# Patient Record
Sex: Male | Born: 1960 | Race: White | Hispanic: No | Marital: Married | Smoking: Former smoker
Health system: Southern US, Community
[De-identification: ages and names within clinical notes are randomized; demographics above are authoritative.]

## PROBLEM LIST (undated history)

## (undated) DIAGNOSIS — Z87442 Personal history of urinary calculi: Secondary | ICD-10-CM

## (undated) DIAGNOSIS — N289 Disorder of kidney and ureter, unspecified: Secondary | ICD-10-CM

## (undated) HISTORY — PX: KNEE SURGERY: SHX244

## (undated) HISTORY — PX: WRIST SURGERY: SHX841

## (undated) HISTORY — PX: SHOULDER SURGERY: SHX246

---

## 1998-09-14 ENCOUNTER — Ambulatory Visit (HOSPITAL_BASED_OUTPATIENT_CLINIC_OR_DEPARTMENT_OTHER): Admission: RE | Admit: 1998-09-14 | Discharge: 1998-09-14 | Payer: Self-pay | Admitting: Orthopedic Surgery

## 2000-08-12 ENCOUNTER — Encounter: Payer: Self-pay | Admitting: Emergency Medicine

## 2000-08-12 ENCOUNTER — Encounter: Admission: RE | Admit: 2000-08-12 | Discharge: 2000-08-12 | Payer: Self-pay | Admitting: Emergency Medicine

## 2002-05-03 ENCOUNTER — Encounter: Payer: Self-pay | Admitting: Emergency Medicine

## 2002-05-03 ENCOUNTER — Encounter: Admission: RE | Admit: 2002-05-03 | Discharge: 2002-05-03 | Payer: Self-pay | Admitting: Emergency Medicine

## 2002-07-07 ENCOUNTER — Ambulatory Visit (HOSPITAL_COMMUNITY): Admission: RE | Admit: 2002-07-07 | Discharge: 2002-07-07 | Payer: Self-pay | Admitting: Gastroenterology

## 2002-11-10 ENCOUNTER — Encounter: Admission: RE | Admit: 2002-11-10 | Discharge: 2002-11-10 | Payer: Self-pay | Admitting: Emergency Medicine

## 2002-11-10 ENCOUNTER — Encounter: Payer: Self-pay | Admitting: Emergency Medicine

## 2004-09-13 ENCOUNTER — Encounter: Admission: RE | Admit: 2004-09-13 | Discharge: 2004-09-13 | Payer: Self-pay | Admitting: Emergency Medicine

## 2004-09-28 ENCOUNTER — Encounter: Admission: RE | Admit: 2004-09-28 | Discharge: 2004-09-28 | Payer: Self-pay | Admitting: Emergency Medicine

## 2004-10-12 ENCOUNTER — Encounter: Admission: RE | Admit: 2004-10-12 | Discharge: 2004-10-12 | Payer: Self-pay | Admitting: Emergency Medicine

## 2006-10-11 ENCOUNTER — Emergency Department (HOSPITAL_COMMUNITY): Admission: EM | Admit: 2006-10-11 | Discharge: 2006-10-11 | Payer: Self-pay | Admitting: Emergency Medicine

## 2007-01-11 ENCOUNTER — Emergency Department (HOSPITAL_COMMUNITY): Admission: EM | Admit: 2007-01-11 | Discharge: 2007-01-11 | Payer: Self-pay | Admitting: Emergency Medicine

## 2008-09-08 ENCOUNTER — Encounter: Admission: RE | Admit: 2008-09-08 | Discharge: 2008-11-10 | Payer: Self-pay | Admitting: Emergency Medicine

## 2009-09-27 ENCOUNTER — Encounter: Admission: RE | Admit: 2009-09-27 | Discharge: 2009-09-27 | Payer: Self-pay | Admitting: Internal Medicine

## 2010-12-01 ENCOUNTER — Encounter: Payer: Self-pay | Admitting: Emergency Medicine

## 2011-03-29 NOTE — Op Note (Signed)
TNAME:  [name], Walter A                       ACCOUNT NO.:  [id number]   MEDICAL RECORD NO.:  [id number]                   PATIENT TYPE:  AMB   LOCATION:  ENDO                                 FACILITY:  [facility]   PHYSICIAN:  Charolett Bumpers, M.D.             DATE OF BIRTH:  20-Oct-1961   DATE OF PROCEDURE:  07/07/2002  DATE OF DISCHARGE:                                 OPERATIVE REPORT   PROCEDURE:  Esophagogastroduodenoscopy.   INDICATIONS FOR PROCEDURE:  Mr. [name] is a 50 year old male born  10-21-1961. Mr. Cowgill has heartburn which is controlled when he  takes his Nexium and also has episodes of nausea unassociated with vomiting,  dysphagia, odynophagia, abdominal pain or gastrointestinal bleeding.   On July 24, 2000, his H. pylori serology was negative. His hepatic  profile was normal. A CBC with differential was normal. His complete  metabolic profile, amylase and lipase were normal.   PAST MEDICAL HISTORY:  1. Migraine headaches.  2. Knee arthroscopy.   ENDOSCOPIST:  Danise Edge, M.D.   PREMEDICATION:  Versed 5 mg, Demerol 50 mg.   ENDOSCOPE:  Olympus gastroscope.   DESCRIPTION OF PROCEDURE:  After obtaining informed consent, Mr. [name] was  placed in the left lateral decubitus position. I administered intravenous  Demerol and intravenous Versed to achieve conscious sedation for the  procedure. The patient's blood pressure, oxygen saturation and cardiac  rhythm were monitored throughout the procedure and documented in the medical  record.   The Olympus gastroscope was passed through the posterior hypopharynx into  the proximal esophagus without difficulty. The hypopharynx, larynx and vocal  cords appeared normal.   ESOPHAGOSCOPY:  The proximal, mid and lower segments of the esophageal  mucosa appeared completely normal. The squamocolumnar junction and  esophagogastric junction are noted at 45 cm from the incisor teeth.  Endoscopically  there is no evidence for the presence of Barrett's esophagus,  esophageal mucosal scarring, erosive esophagitis, or esophageal ulceration.   GASTROSCOPY:  Retroflexed view of the gastric cardia and fundus was normal.  The gastric body, antrum, and pylorus appeared normal.   DUODENOSCOPY:  The duodenal bulb, mid duodenum and distal duodenum appeared  normal.   ASSESSMENT:  Normal esophagogastroduodenoscopy.    RECOMMENDATIONS:  [name] tells me that he has no heartburn if he takes  his Nexium. He may have associated gastroparesis to explain his episodes of  nausea. Reglan can be used empirically for nausea or [name] can be  scheduled for a nuclear medicine solid phase gastric emptying study to  determine if he has gastroparesis associated with gastroesophageal reflux  disease.                                               Charolett Bumpers, M.D.  MKJ/MEDQ  D:  07/07/2002  T:  07/08/2002  Job:  [id number]   cc:   Reuben Likes, M.D.

## 2011-07-01 ENCOUNTER — Ambulatory Visit (HOSPITAL_BASED_OUTPATIENT_CLINIC_OR_DEPARTMENT_OTHER)
Admission: RE | Admit: 2011-07-01 | Discharge: 2011-07-01 | Disposition: A | Discharge status: Home / Self Care | Payer: Managed Care, Other (non HMO) | Source: Ambulatory Visit | Attending: Orthopedic Surgery | Admitting: Orthopedic Surgery

## 2011-07-01 DIAGNOSIS — Z01812 Encounter for preprocedural laboratory examination: Secondary | ICD-10-CM | POA: Insufficient documentation

## 2011-07-01 DIAGNOSIS — M25819 Other specified joint disorders, unspecified shoulder: Secondary | ICD-10-CM | POA: Insufficient documentation

## 2011-07-01 DIAGNOSIS — M19019 Primary osteoarthritis, unspecified shoulder: Secondary | ICD-10-CM | POA: Insufficient documentation

## 2011-07-01 DIAGNOSIS — M67919 Unspecified disorder of synovium and tendon, unspecified shoulder: Secondary | ICD-10-CM | POA: Insufficient documentation

## 2011-07-01 DIAGNOSIS — M719 Bursopathy, unspecified: Secondary | ICD-10-CM | POA: Insufficient documentation

## 2011-07-01 DIAGNOSIS — K219 Gastro-esophageal reflux disease without esophagitis: Secondary | ICD-10-CM | POA: Insufficient documentation

## 2011-07-01 DIAGNOSIS — M24119 Other articular cartilage disorders, unspecified shoulder: Secondary | ICD-10-CM | POA: Insufficient documentation

## 2011-07-01 LAB — POCT HEMOGLOBIN-HEMACUE: Hemoglobin: 15.2 g/dL (ref 13.0–17.0)

## 2011-07-08 NOTE — Op Note (Signed)
NAME:  Walter [name], Walter [name]              ACCOUNT NO.:  [id number]  MEDICAL RECORD NO.:  [id number]  LOCATION:                                 FACILITY:  PHYSICIAN:  Feliberto Gottron. Turner Daniels, M.D.        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  PROCEDURE:  Right shoulder arthroscopic distal clavicle excision, acromioplasty, debridement of inflamed bursa as well as a degenerative tear of the labrum.  SURGEON:  Feliberto Gottron. Turner Daniels, MD  FIRST ASSISTANT:  Shirl Harris, PA.  Anesthetic was interscalene block right plus general endotracheal.  ESTIMATED BLOOD LOSS:  Minimal.  FLUID REPLACEMENT:  Liter of crystalloid.  DRAINS PLACED:  None.  TOURNIQUET TIME:  None.  INDICATIONS FOR PROCEDURE:  The patient is a 50 year old man with symptomatic AC joint arthritis of his right shoulder.  He has failed conservative treatment, anti-inflammatory medicines, exercises, physical therapy, subacromial injection, and AC joint injection gave good temporary relief, but his pain is now returned.  He desires elective arthroscopic distal clavicle excision and decompression of his right shoulder.  MRI scan in 2010 was negative for a rotator cuff tear, so it is unlikely he will need a cuff repair, but we will do if he does.  We will also address any intra-articular pathology while we are there.  The risks and benefits of surgery have been discussed, questions answered.  DESCRIPTION OF PROCEDURE:  The patient identified by armband and received interscalene block anesthetic in the holding area at [facility].  He also received preoperative IV antibiotics and then was taken to  the operating room 5 where the appropriate anesthetic monitors were attached and general endotracheal anesthesia induced with the patient in supine position.  He was then placed in the beach chair position and the right upper extremity prepped and draped in sterile fashion from the ribs to the hemithorax.  Using a #11 blade, we then made standard portals 1.5 cm anterior to the [facility] joint lateral to the junction of the middle and posterior thirds acromion and posterior and posterolateral corner of the acromion process.  The inflow was placed anteriorly, the arthroscope laterally, and a 4.2 great white sucker shaver posteriorly.  The subacromial bursa was quite inflamed.  It was quite tight with traction on the shoulder and subacromial bursectomy was accomplished.  Small bleeders were identified and cauterized with the ArthroCare wand.  We then performed a subacromial decompression and acromioplasty using a 4.5 hooded vortex bur, which gave [place] enough space to examine the rotator cuff, which was intact externally.  The patient had a huge distal clavicle spur, which was then excised with a 4.5 hooded vortex bur removing the distal centimeter of the inferior aspect of the clavicle.  The distal clavicle itself was denuded of any cartilage and was truly arthritic.  We then made a supplemental anterior portal 1 cm medial to the first anterior portal allowing introduction of the 4.5  vortex through that portal.  Once the distal clavicle excision had been accomplished, photographic documentation was made at this portion of the procedure.  The arthroscope was then repositioned in the glenohumeral joint using the posterior portal.  There we documented normal articular cartilage in the glenohumeral joint.  There was some mild degeneration of the labrum.  This was widely debrided.  We used a 3.5 gator sucker shaver for the debridement of the labrum.  We documented the integrity of the supraspinatus and  infraspinatus insertions as well as the biceps anchor and again the articular surface of the glenohumeral joint.  The shoulder was then irrigated out with normal saline solution.  Arthroscopic instruments were removed and a dressing of Xeroform, 4 x 4 dressing sponges, paper tape, and a sling applied.  At this point, the patient was laid supine, awakened, extubated, and taken to the recovery room without difficulty.     Feliberto Gottron. Turner Daniels, M.D.     Ovid Curd  D:  07/01/2011  T:  07/02/2011  Job:  [id number]  Electronically Signed by Gean Birchwood M.D. on 07/08/2011 01:10:41 PM

## 2015-05-04 ENCOUNTER — Other Ambulatory Visit: Payer: Self-pay | Admitting: Internal Medicine

## 2015-05-04 DIAGNOSIS — R1011 Right upper quadrant pain: Secondary | ICD-10-CM

## 2015-05-08 ENCOUNTER — Other Ambulatory Visit: Payer: Self-pay | Admitting: Internal Medicine

## 2015-05-08 ENCOUNTER — Ambulatory Visit
Admission: RE | Admit: 2015-05-08 | Discharge: 2015-05-08 | Disposition: A | Discharge status: Home / Self Care | Payer: Managed Care, Other (non HMO) | Source: Ambulatory Visit | Attending: Internal Medicine | Admitting: Internal Medicine

## 2015-05-08 DIAGNOSIS — R1011 Right upper quadrant pain: Secondary | ICD-10-CM

## 2015-10-06 ENCOUNTER — Emergency Department (HOSPITAL_COMMUNITY)
Admission: EM | Admit: 2015-10-06 | Discharge: 2015-10-06 | Disposition: A | Discharge status: Home / Self Care | Payer: 59 | Source: Home / Self Care | Attending: Family Medicine | Admitting: Family Medicine

## 2015-10-06 ENCOUNTER — Encounter (HOSPITAL_COMMUNITY): Payer: Self-pay | Admitting: Emergency Medicine

## 2015-10-06 DIAGNOSIS — N200 Calculus of kidney: Secondary | ICD-10-CM

## 2015-10-06 HISTORY — DX: Disorder of kidney and ureter, unspecified: N28.9

## 2015-10-06 LAB — POCT URINALYSIS DIP (DEVICE)
Bilirubin Urine: NEGATIVE
Glucose, UA: NEGATIVE mg/dL
Ketones, ur: NEGATIVE mg/dL
Nitrite: NEGATIVE
PH: 6 (ref 5.0–8.0)
PROTEIN: NEGATIVE mg/dL
SPECIFIC GRAVITY, URINE: 1.015 (ref 1.005–1.030)
Urobilinogen, UA: 0.2 mg/dL (ref 0.0–1.0)

## 2015-10-06 NOTE — ED Notes (Signed)
C/o kidney stone concerns.  Reports that starting Monday, noted changes in urinary habits.  Has noticed blood in urine on Monday and Wednesday.  Complains of back pain and pain that radiates into groin and testicular pain.  Patient particularly noticed changes in night time urinating.  Patient having top get up frequently, feeling the urge, but little results and no relief at that time.

## 2015-10-06 NOTE — ED Provider Notes (Signed)
CSN: [id number]     Arrival date & time 10/06/15  1311 History   First MD Initiated Contact with Patient 10/06/15 1436     Chief Complaint  Patient presents with  . Urinary Retention   (Consider location/radiation/quality/duration/timing/severity/associated sxs/prior Treatment) Patient is a 54 y.o. male presenting with frequency. The history is provided by the patient and the spouse.  Urinary Frequency This is a new problem. Episode onset: has urologist appt on wed, h/o 4 stones passed, present stone on right side , is having difficulty with urine passage but pt and wife report nl cystoscopyand no prostate problems, also taking rapiflow med. The problem has not changed since onset.Pertinent negatives include no abdominal pain.    Past Medical History  Diagnosis Date  . Renal disorder    Past Surgical History  Procedure Laterality Date  . Knee surgery    . Wrist surgery    . Shoulder surgery     History reviewed. No pertinent family history. Social History  Substance Use Topics  . Smoking status: Never Smoker   . Smokeless tobacco: None  . Alcohol Use: Yes    Review of Systems  Gastrointestinal: Negative.  Negative for abdominal pain.  Genitourinary: Positive for difficulty urinating and testicular pain. Negative for dysuria, urgency, frequency, flank pain, decreased urine volume, penile swelling, scrotal swelling and enuresis.  Musculoskeletal: Negative.   All other systems reviewed and are negative.   Allergies  Review of patient's allergies indicates no known allergies.  Home Medications   Prior to Admission medications   Medication Sig Start Date End Date Taking? Authorizing Provider  Silodosin (RAPAFLO PO) Take by mouth.   Yes Historical Provider, MD   Meds Ordered and Administered this Visit  Medications - No data to display  BP 129/84 mmHg  Pulse 69  Temp(Src) 98.7 F (37.1 C) (Oral)  SpO2 98% No data found.   Physical Exam  Constitutional: He is  oriented to person, place, and time. He appears well-developed and well-nourished. No distress.  Pulmonary/Chest: Effort normal and breath sounds normal.  Abdominal: Soft. Bowel sounds are normal. He exhibits no distension and no mass. There is no tenderness. There is no rebound and no guarding.  Musculoskeletal: He exhibits no edema or tenderness.  Neurological: He is alert and oriented to person, place, and time.  Skin: Skin is warm and dry.  Nursing note and vitals reviewed.   ED Course  Procedures (including critical care time)  Labs Review Labs Reviewed  POCT URINALYSIS DIP (DEVICE) - Abnormal; Notable for the following:    Hgb urine dipstick TRACE (*)    Leukocytes, UA TRACE (*)    All other components within normal limits   U/a neg.  Imaging Review No results found.   Visual Acuity Review  Right Eye Distance:   Left Eye Distance:   Bilateral Distance:    Right Eye Near:   Left Eye Near:    Bilateral Near:         MDM   1. Kidney stone on right side        Billy Fischer, MD 10/06/15 [phone]

## 2015-10-06 NOTE — Discharge Instructions (Signed)
Continue your medicine, drink plenty of water, see your urologist as planned or call if problem gets worse.

## 2016-10-15 ENCOUNTER — Other Ambulatory Visit: Payer: Self-pay | Admitting: Urology

## 2016-10-15 ENCOUNTER — Encounter (HOSPITAL_COMMUNITY): Payer: Self-pay | Admitting: *Deleted

## 2016-10-16 ENCOUNTER — Emergency Department (HOSPITAL_COMMUNITY): Payer: 59 | Admitting: Certified Registered Nurse Anesthetist

## 2016-10-16 ENCOUNTER — Encounter (HOSPITAL_COMMUNITY)
Admission: EM | Disposition: A | Discharge status: Home / Self Care | Payer: Self-pay | Source: Home / Self Care | Attending: Emergency Medicine

## 2016-10-16 ENCOUNTER — Observation Stay (HOSPITAL_COMMUNITY)
Admission: EM | Admit: 2016-10-16 | Discharge: 2016-10-17 | Disposition: A | Discharge status: Home / Self Care | Payer: 59 | Attending: Urology | Admitting: Urology

## 2016-10-16 DIAGNOSIS — N202 Calculus of kidney with calculus of ureter: Principal | ICD-10-CM | POA: Insufficient documentation

## 2016-10-16 DIAGNOSIS — N23 Unspecified renal colic: Secondary | ICD-10-CM

## 2016-10-16 DIAGNOSIS — Z87891 Personal history of nicotine dependence: Secondary | ICD-10-CM | POA: Insufficient documentation

## 2016-10-16 DIAGNOSIS — N201 Calculus of ureter: Secondary | ICD-10-CM | POA: Diagnosis present

## 2016-10-16 DIAGNOSIS — E860 Dehydration: Secondary | ICD-10-CM | POA: Diagnosis not present

## 2016-10-16 DIAGNOSIS — N200 Calculus of kidney: Secondary | ICD-10-CM

## 2016-10-16 DIAGNOSIS — Z87442 Personal history of urinary calculi: Secondary | ICD-10-CM | POA: Diagnosis not present

## 2016-10-16 HISTORY — PX: CYSTOSCOPY W/ URETERAL STENT PLACEMENT: SHX1429

## 2016-10-16 LAB — CBC WITH DIFFERENTIAL/PLATELET
BASOS ABS: 0 10*3/uL (ref 0.0–0.1)
BASOS PCT: 0 %
EOS PCT: 0 %
Eosinophils Absolute: 0 10*3/uL (ref 0.0–0.7)
HEMATOCRIT: 40.3 % (ref 39.0–52.0)
Hemoglobin: 13.8 g/dL (ref 13.0–17.0)
Lymphocytes Relative: 5 %
Lymphs Abs: 0.6 10*3/uL — ABNORMAL LOW (ref 0.7–4.0)
MCH: 30.3 pg (ref 26.0–34.0)
MCHC: 34.2 g/dL (ref 30.0–36.0)
MCV: 88.6 fL (ref 78.0–100.0)
Monocytes Absolute: 0.6 10*3/uL (ref 0.1–1.0)
Monocytes Relative: 6 %
NEUTROS ABS: 9.1 10*3/uL — AB (ref 1.7–7.7)
Neutrophils Relative %: 89 %
PLATELETS: 217 10*3/uL (ref 150–400)
RBC: 4.55 MIL/uL (ref 4.22–5.81)
RDW: 12.6 % (ref 11.5–15.5)
WBC: 10.3 10*3/uL (ref 4.0–10.5)

## 2016-10-16 LAB — URINALYSIS, ROUTINE W REFLEX MICROSCOPIC
Bilirubin Urine: NEGATIVE
GLUCOSE, UA: NEGATIVE mg/dL
Ketones, ur: 5 mg/dL — AB
Nitrite: NEGATIVE
PROTEIN: NEGATIVE mg/dL
Specific Gravity, Urine: 1.023 (ref 1.005–1.030)
Squamous Epithelial / HPF: NONE SEEN
pH: 5 (ref 5.0–8.0)

## 2016-10-16 LAB — BASIC METABOLIC PANEL
Anion gap: 8 (ref 5–15)
BUN: 21 mg/dL — ABNORMAL HIGH (ref 6–20)
CO2: 24 mmol/L (ref 22–32)
Calcium: 8.7 mg/dL — ABNORMAL LOW (ref 8.9–10.3)
Chloride: 103 mmol/L (ref 101–111)
Creatinine, Ser: 1.4 mg/dL — ABNORMAL HIGH (ref 0.61–1.24)
GFR, EST NON AFRICAN AMERICAN: 56 mL/min — AB (ref >60)
Glucose, Bld: 126 mg/dL — ABNORMAL HIGH (ref 65–99)
Potassium: 4.5 mmol/L (ref 3.5–5.1)
Sodium: 135 mmol/L (ref 135–145)

## 2016-10-16 SURGERY — CYSTOSCOPY, WITH RETROGRADE PYELOGRAM AND URETERAL STENT INSERTION
Anesthesia: General | Laterality: Left

## 2016-10-16 MED ORDER — HYDROMORPHONE HCL 1 MG/ML IJ SOLN
1.0000 mg | Freq: Once | INTRAMUSCULAR | Status: AC
  Administered 2016-10-16: 1 mg via INTRAVENOUS
  Filled 2016-10-16: qty 1

## 2016-10-16 MED ORDER — LACTATED RINGERS IV SOLN
INTRAVENOUS | Status: DC
  Administered 2016-10-16: 23:00:00 via INTRAVENOUS

## 2016-10-16 MED ORDER — FENTANYL CITRATE (PF) 100 MCG/2ML IJ SOLN
INTRAMUSCULAR | Status: DC | PRN
  Administered 2016-10-16 (×2): 25 ug via INTRAVENOUS
  Administered 2016-10-16: 50 ug via INTRAVENOUS

## 2016-10-16 MED ORDER — KETOROLAC TROMETHAMINE 15 MG/ML IJ SOLN
30.0000 mg | Freq: Once | INTRAMUSCULAR | Status: AC
  Administered 2016-10-16: 30 mg via INTRAVENOUS
  Filled 2016-10-16: qty 2

## 2016-10-16 MED ORDER — ONDANSETRON HCL 4 MG/2ML IJ SOLN
INTRAMUSCULAR | Status: DC | PRN
  Administered 2016-10-16: 4 mg via INTRAVENOUS

## 2016-10-16 MED ORDER — SODIUM CHLORIDE 0.9 % IV BOLUS (SEPSIS)
1000.0000 mL | Freq: Once | INTRAVENOUS | Status: AC
  Administered 2016-10-16: 1000 mL via INTRAVENOUS

## 2016-10-16 MED ORDER — CEFAZOLIN SODIUM-DEXTROSE 2-3 GM-% IV SOLR
2.0000 g | Freq: Once | INTRAVENOUS | Status: AC
  Administered 2016-10-16: 2 g via INTRAVENOUS

## 2016-10-16 MED ORDER — LIDOCAINE 2% (20 MG/ML) 5 ML SYRINGE
INTRAMUSCULAR | Status: AC
  Filled 2016-10-16: qty 5

## 2016-10-16 MED ORDER — ONDANSETRON HCL 4 MG/2ML IJ SOLN
INTRAMUSCULAR | Status: AC
  Filled 2016-10-16: qty 2

## 2016-10-16 MED ORDER — IOHEXOL 300 MG/ML  SOLN
INTRAMUSCULAR | Status: DC | PRN
  Administered 2016-10-16: 10 mL

## 2016-10-16 MED ORDER — PROMETHAZINE HCL 25 MG/ML IJ SOLN: 6.2500 mg | INTRAMUSCULAR | Status: DC | PRN

## 2016-10-16 MED ORDER — CEFAZOLIN SODIUM-DEXTROSE 2-4 GM/100ML-% IV SOLN
INTRAVENOUS | Status: AC
  Filled 2016-10-16: qty 100

## 2016-10-16 MED ORDER — MIDAZOLAM HCL 2 MG/2ML IJ SOLN
INTRAMUSCULAR | Status: AC
  Filled 2016-10-16: qty 2

## 2016-10-16 MED ORDER — PROPOFOL 10 MG/ML IV BOLUS
INTRAVENOUS | Status: AC
  Filled 2016-10-16: qty 20

## 2016-10-16 MED ORDER — HYDROMORPHONE HCL 1 MG/ML IJ SOLN: 0.2500 mg | INTRAMUSCULAR | Status: DC | PRN

## 2016-10-16 MED ORDER — SUCCINYLCHOLINE CHLORIDE 200 MG/10ML IV SOSY
PREFILLED_SYRINGE | INTRAVENOUS | Status: DC | PRN
  Administered 2016-10-16: 100 mg via INTRAVENOUS

## 2016-10-16 MED ORDER — FENTANYL CITRATE (PF) 100 MCG/2ML IJ SOLN
INTRAMUSCULAR | Status: AC
  Filled 2016-10-16: qty 2

## 2016-10-16 MED ORDER — SUCCINYLCHOLINE CHLORIDE 200 MG/10ML IV SOSY
PREFILLED_SYRINGE | INTRAVENOUS | Status: AC
  Filled 2016-10-16: qty 10

## 2016-10-16 MED ORDER — ONDANSETRON HCL 4 MG/2ML IJ SOLN
4.0000 mg | Freq: Once | INTRAMUSCULAR | Status: AC
  Administered 2016-10-16: 4 mg via INTRAVENOUS
  Filled 2016-10-16: qty 2

## 2016-10-16 MED ORDER — MIDAZOLAM HCL 5 MG/5ML IJ SOLN
INTRAMUSCULAR | Status: DC | PRN
  Administered 2016-10-16: 2 mg via INTRAVENOUS

## 2016-10-16 MED ORDER — PROPOFOL 10 MG/ML IV BOLUS
INTRAVENOUS | Status: DC | PRN
  Administered 2016-10-16: 180 mg via INTRAVENOUS

## 2016-10-16 MED ORDER — LIDOCAINE 2% (20 MG/ML) 5 ML SYRINGE
INTRAMUSCULAR | Status: DC | PRN
  Administered 2016-10-16: 100 mg via INTRAVENOUS

## 2016-10-16 MED ORDER — OXYBUTYNIN CHLORIDE 5 MG PO TABS: 5.0000 mg | ORAL_TABLET | Freq: Three times a day (TID) | ORAL | 1 refills | Status: AC | PRN

## 2016-10-16 MED ORDER — SODIUM CHLORIDE 0.9 % IR SOLN
Status: DC | PRN
  Administered 2016-10-16: 3000 mL

## 2016-10-16 SURGICAL SUPPLY — 14 items
BAG URO CATCHER STRL LF (MISCELLANEOUS) ×3 IMPLANT
CATH INTERMIT  6FR 70CM (CATHETERS) ×3 IMPLANT
CLOTH BEACON ORANGE TIMEOUT ST (SAFETY) ×3 IMPLANT
GLOVE BIOGEL M 8.0 STRL (GLOVE) ×3 IMPLANT
GOWN STRL REUS W/ TWL XL LVL3 (GOWN DISPOSABLE) ×1 IMPLANT
GOWN STRL REUS W/TWL LRG LVL3 (GOWN DISPOSABLE) ×3 IMPLANT
GOWN STRL REUS W/TWL XL LVL3 (GOWN DISPOSABLE) ×2
GUIDEWIRE ANG ZIPWIRE 038X150 (WIRE) IMPLANT
GUIDEWIRE STR DUAL SENSOR (WIRE) ×3 IMPLANT
MANIFOLD NEPTUNE II (INSTRUMENTS) ×3 IMPLANT
PACK CYSTO (CUSTOM PROCEDURE TRAY) ×3 IMPLANT
STENT CONTOUR 6FRX26X.038 (STENTS) ×3 IMPLANT
TUBING CONNECTING 10 (TUBING) ×2 IMPLANT
TUBING CONNECTING 10' (TUBING) ×1

## 2016-10-16 NOTE — H&P (Signed)
Urology History and Physical Exam  CC: Kidney stone  HPI: 55 year old male presents to the emergency room for persistent/intractable pain.  He was diagnosed with a left proximal ureteral stone 9 millimeters in size 2-3 days ago.  He was scheduled for shockwave lithotripsy on December 11 by Dr. Irine Seal at his first visit with [place] earlier this week.  Because of the patient, persistent pain, he came to the emergency room.  Despite repeated IV narcotics, he still has significant pain.  He is admitted at this time for stent placement for urgent decompression of his left renal unit in advance of eventual lithotripsy next week.  He denies any recent fever or chills.  He denies any vomiting, but has had nausea.  PMH: Past Medical History:  Diagnosis Date  . History of kidney stones   . Renal disorder     PSH: Past Surgical History:  Procedure Laterality Date  . KNEE SURGERY    . SHOULDER SURGERY    . WRIST SURGERY      Allergies: No Known Allergies  Medications:  (Not in a hospital admission)   Social History: Social History   Social History  . Marital status: Married    Spouse name: N/A  . Number of children: N/A  . Years of education: N/A   Occupational History  . Not on file.   Social History Main Topics  . Smoking status: Former Smoker    Packs/day: 1.00    Years: 8.00    Quit date: 10/15/1982  . Smokeless tobacco: Never Used  . Alcohol use Yes  . Drug use: No  . Sexual activity: Not on file   Other Topics Concern  . Not on file   Social History Narrative  . No narrative on file    Family History: No family history on file.  Review of Systems: Positive: Nausea, flank pain Negative:   A further 10 point review of systems was negative except what is listed in the HPI.                  Physical Exam: @VITALS2 @ General: No acute distress.  Awake. Head:  Normocephalic.  Atraumatic. ENT:  EOMI.  Mucous membranes moist Neck:  Supple.  No  lymphadenopathy. CV:  S1 present. S2 present. Regular rate. Pulmonary: Equal effort bilaterally.  Clear to auscultation bilaterally. Abdomen: Soft.  Costovertebral area on left tender to palpation. Skin:  Normal turgor.  No visible rash. Extremity: No gross deformity of bilateral upper extremities.  No gross deformity of                             lower extremities. Neurologic: Alert. Appropriate mood.   Studies:  Recent Labs     10/16/16  1938  HGB  13.8  WBC  10.3  PLT  217    Recent Labs     10/16/16  1938  NA  135  K  4.5  CL  103  CO2  24  BUN  21*  CREATININE  1.40*  CALCIUM  8.7*  GFRNONAA  56*  GFRAA  >60     No results for input(s): INR, APTT in the last 72 hours.  Invalid input(s): PT   Invalid input(s): ABG  I reviewed the patient's CT images.  He has a 6 by 10 millimeter left UPJ stone on CT scan recently performed.  Assessment:  Left upper ureteral stone, moderate in size  with intractable, recurrent pain  Plan: I discussed management with the patient, his wife and son.  I would recommend at this point, cystoscopy and double-J stent placement.  The patient was notified of the procedure, minimal risks and complications, and the fact that this stent will be left in until after his lithotripsy.  He agrees to proceed.

## 2016-10-16 NOTE — ED Notes (Signed)
Hospitalist at bedside 

## 2016-10-16 NOTE — Transfer of Care (Signed)
Immediate Anesthesia Transfer of Care Note  Patient: Walter [name]  Procedure(s) Performed: Procedure(s): CYSTOSCOPY WITH RETROGRADE PYELOGRAM/URETERAL STENT PLACEMENT (Left)  Patient Location: PACU  Anesthesia Type:General  Level of Consciousness:  sedated, patient cooperative and responds to stimulation  Airway & Oxygen Therapy:Patient Spontanous Breathing and Patient connected to face mask oxgen  Post-op Assessment:  Report given to PACU RN and Post -op Vital signs reviewed and stable  Post vital signs:  Reviewed and stable  Last Vitals:  Vitals:   10/16/16 2208 10/16/16 2326  BP: 120/73 125/81  Pulse: 88 95  Resp: 15 14  Temp:  37 C    Complications: No apparent anesthesia complications

## 2016-10-16 NOTE — Anesthesia Preprocedure Evaluation (Signed)
Anesthesia Evaluation  Patient identified by MRN, date of birth, ID band Patient awake    Reviewed: Allergy & Precautions, NPO status , Patient's Chart, lab work & pertinent test results  Airway Mallampati: II  TM Distance: >3 FB Neck ROM: Full    Dental no notable dental hx. (+) Dental Advisory Given   Pulmonary neg pulmonary ROS, former smoker,    Pulmonary exam normal        Cardiovascular negative cardio ROS Normal cardiovascular exam     Neuro/Psych negative neurological ROS  negative psych ROS   GI/Hepatic negative GI ROS, Neg liver ROS,   Endo/Other  negative endocrine ROS  Renal/GU   negative genitourinary   Musculoskeletal negative musculoskeletal ROS (+)   Abdominal   Peds negative pediatric ROS (+)  Hematology negative hematology ROS (+)   Anesthesia Other Findings   Reproductive/Obstetrics negative OB ROS                             Anesthesia Physical Anesthesia Plan  ASA: II and emergent  Anesthesia Plan: General   Post-op Pain Management:    Induction: Intravenous, Rapid sequence and Cricoid pressure planned  Airway Management Planned: Oral ETT  Additional Equipment:   Intra-op Plan:   Post-operative Plan: Extubation in OR  Informed Consent: I have reviewed the patients History and Physical, chart, labs and discussed the procedure including the risks, benefits and alternatives for the proposed anesthesia with the patient or authorized representative who has indicated his/her understanding and acceptance.   Dental advisory given  Plan Discussed with: CRNA, Anesthesiologist and Surgeon  Anesthesia Plan Comments:         Anesthesia Quick Evaluation

## 2016-10-16 NOTE — ED Provider Notes (Signed)
[facility] DEPT Provider Note   CSN: [id number] Arrival date & time: 10/16/16  1653     History   Chief Complaint Chief Complaint  Patient presents with  . Flank Pain    HPI Walter [name] is a 55 y.o. male.  HPI 54 year old male with past medical history of recently diagnosed 7 mm left-sided kidney stone here with ongoing flank pain. Patient is currently scheduled for outpatient lithotripsy with Dr. Jeffie Pollock on 12/11. He was recently seen in the office on Tuesday and given pain control. Over the last 24 hours, he has had progressively worsening aching, gnawing, crampy-like pain in his left flank. He has also had nausea and vomiting and has been increasingly struggling with keeping his food down. He denies any fevers. He was told to come to the ED if his pain was unresolved surgery resents today. Pain is made worse with any movement. Denies any alleviating factors. He did take his oxycodone prior to arrival with only minimal improvement in his pain.  Past Medical History:  Diagnosis Date  . History of kidney stones   . Renal disorder     Patient Active Problem List   Diagnosis Date Noted  . Calculus of kidney 10/17/2016  . Calculus of ureter 10/16/2016    Past Surgical History:  Procedure Laterality Date  . KNEE SURGERY    . SHOULDER SURGERY    . WRIST SURGERY         Home Medications    Prior to Admission medications   Medication Sig Start Date End Date Taking? Authorizing Provider  acetaminophen (TYLENOL) 500 MG tablet Take 1,000 mg by mouth every 6 (six) hours as needed for mild pain, moderate pain, fever or headache.   Yes Historical Provider, MD  fluticasone (FLONASE) 50 MCG/ACT nasal spray Place 2 sprays into both nostrils at bedtime.   Yes Historical Provider, MD  Ketorolac Tromethamine (TORADOL IJ) Inject as directed once.   Yes Historical Provider, MD  oxyCODONE-acetaminophen (PERCOCET/ROXICET) 5-325 MG tablet Take 1-2 tablets by mouth every 4 (four) hours  as needed for severe pain.    Yes Historical Provider, MD  oxybutynin (DITROPAN) 5 MG tablet Take 1 tablet (5 mg total) by mouth every 8 (eight) hours as needed for bladder spasms. 10/16/16   Franchot Gallo, MD    Family History No family history on file.  Social History Social History  Substance Use Topics  . Smoking status: Former Smoker    Packs/day: 1.00    Years: 8.00    Quit date: 10/15/1982  . Smokeless tobacco: Never Used  . Alcohol use Yes     Allergies   Patient has no known allergies.   Review of Systems Review of Systems  Constitutional: Negative for chills, fatigue and fever.  HENT: Negative for congestion and rhinorrhea.   Eyes: Negative for visual disturbance.  Respiratory: Negative for cough, shortness of breath and wheezing.   Cardiovascular: Negative for chest pain and leg swelling.  Gastrointestinal: Negative for abdominal pain, diarrhea, nausea and vomiting.  Genitourinary: Positive for decreased urine volume, flank pain and frequency. Negative for dysuria.  Musculoskeletal: Negative for neck pain and neck stiffness.  Skin: Negative for rash and wound.  Allergic/Immunologic: Negative for immunocompromised state.  Neurological: Negative for syncope, weakness and headaches.  All other systems reviewed and are negative.    Physical Exam Updated Vital Signs BP 135/81 (BP Location: Right Arm)   Pulse 87   Temp 97.6 F (36.4 C)   Resp 16  Ht 6' (1.829 m)   Wt 220 lb (99.8 kg)   SpO2 98%   BMI 29.84 kg/m   Physical Exam  Constitutional: He is oriented to person, place, and time. He appears well-developed and well-nourished. He appears distressed.  HENT:  Head: Normocephalic and atraumatic.  Eyes: Conjunctivae are normal.  Neck: Neck supple.  Cardiovascular: Normal rate, regular rhythm and normal heart sounds.  Exam reveals no friction rub.   No murmur heard. Pulmonary/Chest: Effort normal and breath sounds normal. No respiratory distress. He  has no wheezes. He has no rales.  Abdominal: Soft. Bowel sounds are normal. He exhibits no distension and no mass. There is tenderness (Left upper and lower quadrant). There is no rebound and no guarding.  Musculoskeletal: He exhibits no edema.  Neurological: He is alert and oriented to person, place, and time. He exhibits normal muscle tone.  Skin: Skin is warm. Capillary refill takes less than 2 seconds.  Psychiatric: He has a normal mood and affect.  Nursing note and vitals reviewed.    ED Treatments / Results  Labs (all labs ordered are listed, but only abnormal results are displayed) Labs Reviewed  URINALYSIS, ROUTINE W REFLEX MICROSCOPIC - Abnormal; Notable for the following:       Result Value   Hgb urine dipstick MODERATE (*)    Ketones, ur 5 (*)    Leukocytes, UA SMALL (*)    Bacteria, UA RARE (*)    All other components within normal limits  CBC WITH DIFFERENTIAL/PLATELET - Abnormal; Notable for the following:    Neutro Abs 9.1 (*)    Lymphs Abs 0.6 (*)    All other components within normal limits  BASIC METABOLIC PANEL - Abnormal; Notable for the following:    Glucose, Bld 126 (*)    BUN 21 (*)    Creatinine, Ser 1.40 (*)    Calcium 8.7 (*)    GFR calc non Af Amer 56 (*)    All other components within normal limits    EKG  EKG Interpretation None       Radiology No results found.  Procedures Procedures (including critical care time)  Medications Ordered in ED Medications  0.45 % sodium chloride infusion ( Intravenous Not Given 10/17/16 0109)  HYDROcodone-acetaminophen (NORCO/VICODIN) 5-325 MG per tablet 1-2 tablet (not administered)  lactated ringers infusion ( Intravenous Anesthesia Volume Adjustment 10/16/16 2332)  fluticasone (FLONASE) 50 MCG/ACT nasal spray 2 spray (2 sprays Each Nare Given 10/17/16 0108)  oxyCODONE-acetaminophen (PERCOCET/ROXICET) 5-325 MG per tablet 1-2 tablet (not administered)  acetaminophen (TYLENOL) tablet 1,000 mg (not  administered)  dextrose 5 %-0.45 % sodium chloride infusion ( Intravenous Not Given 10/17/16 0109)  HYDROmorphone (DILAUDID) injection 0.5-1 mg (not administered)  oxybutynin (DITROPAN) tablet 5 mg (not administered)  ondansetron (ZOFRAN) injection 4 mg (not administered)  sodium chloride 0.9 % bolus 1,000 mL (0 mLs Intravenous Stopped 10/16/16 2037)  HYDROmorphone (DILAUDID) injection 1 mg (1 mg Intravenous Given 10/16/16 1941)  ondansetron (ZOFRAN) injection 4 mg (4 mg Intravenous Given 10/16/16 1941)  sodium chloride 0.9 % bolus 1,000 mL (1,000 mLs Intravenous New Bag/Given 10/16/16 2043)  ketorolac (TORADOL) 15 MG/ML injection 30 mg (30 mg Intravenous Given 10/16/16 2043)  ceFAZolin (ANCEF) IVPB 2 g/50 mL premix ( Intravenous MAR Unhold 10/16/16 2358)     Initial Impression / Assessment and Plan / ED Course  I have reviewed the triage vital signs and the nursing notes.  Pertinent labs & imaging results that were available during my  care of the patient were reviewed by me and considered in my medical decision making (see chart for details).  Clinical Course     55 year old male with known left 7 mm renal stone here with ongoing left flank pain. On arrival, vital signs are stable. He appears in moderate distress due to pain. Labs show mild dehydration but no evidence of infection. Symptoms persist despite IV fluids and analgesia here in the ED. Discussed with urology, who recommended Toradol 30 mg and reassessment.  Patient has persistent pain after Toradol. He will be admitted for stenting and pain control. IV fluids given. Hemodynamically stable.  Final Clinical Impressions(s) / ED Diagnoses   Final diagnoses:  Kidney stone  Ureteral colic     Duffy Bruce, MD 10/17/16 [phone]

## 2016-10-16 NOTE — Anesthesia Procedure Notes (Signed)
Procedure Name: Intubation Date/Time: 10/16/2016 11:01 PM Performed by: West Pugh Pre-anesthesia Checklist: Patient identified, Emergency Drugs available, Suction available, Patient being monitored and Timeout performed Patient Re-evaluated:Patient Re-evaluated prior to inductionOxygen Delivery Method: Circle system utilized Preoxygenation: Pre-oxygenation with 100% oxygen Intubation Type: IV induction, Rapid sequence and Cricoid Pressure applied Laryngoscope Size: Mac and 4 Grade View: Grade I Tube type: Oral Tube size: 7.5 mm Number of attempts: 1 Airway Equipment and Method: Stylet Placement Confirmation: ETT inserted through vocal cords under direct vision,  positive ETCO2,  CO2 detector and breath sounds checked- equal and bilateral Secured at: 22 cm Tube secured with: Tape Dental Injury: Teeth and Oropharynx as per pre-operative assessment

## 2016-10-16 NOTE — Op Note (Signed)
Preoperative diagnosis: 9 millimeter left proximal ureteral stone with intractable pain  Postoperative diagnosis: Same   Procedure: Cystoscopy, left retrograde ureteropyelogram with fluoroscopic interpretation, placement of left double-J stent--6 French by 24 centimeter contour double-J stent without string    Surgeon: Lillette Boxer. Ezell Melikian, M.D.   Anesthesia: Gen.   Complications: None  Specimen(s): None  Drain(s): 24 centimeter a 6 [place] contour double-J stent without string  Indications: 55 year old male with recently diagnosed 9 millimeter proximal left ureteral stone.  He was scheduled for outpatient shockwave lithotripsy in 5 more days.  Unfortunately, he has had persistent, intractable pain, presenting to the emergency room today.  Even there, IV narcotics failed to adequately relieve his pain well enough for discharge.  He presents this time for cystoscopy and stent placement for urgent decompression of his left kidney, in advance of eventual lithotripsy in 5 days.    Technique and findings: The patient was properly identified and marked.  Prior to going to the operating room.  He received 2 grams of Ancef intravenously.  He was then taken the operating room where general anesthetic was administered with the LMA.  He is placed in the dorsolithotomy position.  Genitalia and perineum were prepped and draped.  Proper timeout was performed.  21 French panendoscope advanced under direct vision through his urethra which was free of stricture or lesions.  Prostate was nonobstructive.  The bladder was entered and inspected circumferentially.  There were no tumors, foreign bodies or trabeculations.  Ureteral orifices were normal in configuration and location.  The 6 [place] open-ended catheter was advanced through the cystoscope.  Using Omnipaque, general retrograde pyelogram was performed.  This revealed a normal ureter.  At the UPJ, there was a filling defect with proximal pyelocaliectasis.   Filling defect was consistent with the previously mentioned stone.  No other filling defects were seen within the pyelocalyceal system.  At this point.  A 0.038 inch sensor-tip guidewire was advanced through the open-ended catheter, with the curl seen in the upper pole calyceal system.  Over top of this, a 6 [place] by 26 centimeter contour double-J stent was advanced into the ureter.  Following proper positioning, the guidewire was removed and proper proximal and distal curls were seen with fluoroscopic and cystoscopic guidance, respectively.  The bladder was drained and the cystoscope removed.    The patient was then awakened and taken to the PACU in stable condition.  Tolerated procedure well.

## 2016-10-16 NOTE — ED Triage Notes (Signed)
Pt reports being dx with 91mm stone located in left kidney. Pt is scheduled for surgery 10/21/16. Pt reports pain severity increasing and UOP decreasing today. Pt reports nausea denies vomiting. Pt states pain meds controlled pain yesterday, denies pain control today.

## 2016-10-17 ENCOUNTER — Encounter (HOSPITAL_COMMUNITY): Payer: Self-pay

## 2016-10-17 DIAGNOSIS — N200 Calculus of kidney: Secondary | ICD-10-CM

## 2016-10-17 MED ORDER — SODIUM CHLORIDE 0.45 % IV SOLN: INTRAVENOUS | Status: DC

## 2016-10-17 MED ORDER — OXYBUTYNIN CHLORIDE 5 MG PO TABS: 5.0000 mg | ORAL_TABLET | Freq: Three times a day (TID) | ORAL | Status: DC | PRN

## 2016-10-17 MED ORDER — ONDANSETRON HCL 4 MG/2ML IJ SOLN: 4.0000 mg | INTRAMUSCULAR | Status: DC | PRN

## 2016-10-17 MED ORDER — HYDROMORPHONE HCL 1 MG/ML IJ SOLN: 0.5000 mg | INTRAMUSCULAR | Status: DC | PRN

## 2016-10-17 MED ORDER — DEXTROSE-NACL 5-0.45 % IV SOLN: INTRAVENOUS | Status: DC

## 2016-10-17 MED ORDER — HYDROCODONE-ACETAMINOPHEN 5-325 MG PO TABS: 1.0000 | ORAL_TABLET | ORAL | Status: DC | PRN

## 2016-10-17 MED ORDER — OXYCODONE-ACETAMINOPHEN 5-325 MG PO TABS: 1.0000 | ORAL_TABLET | ORAL | Status: DC | PRN

## 2016-10-17 MED ORDER — FLUTICASONE PROPIONATE 50 MCG/ACT NA SUSP
2.0000 | Freq: Every day | NASAL | Status: DC
  Administered 2016-10-17: 2 via NASAL
  Filled 2016-10-17: qty 16

## 2016-10-17 MED ORDER — ACETAMINOPHEN 500 MG PO TABS: 1000.0000 mg | ORAL_TABLET | Freq: Four times a day (QID) | ORAL | Status: DC | PRN

## 2016-10-17 NOTE — Anesthesia Postprocedure Evaluation (Signed)
Anesthesia Post Note  Patient: Eliezer Lofts  Procedure(s) Performed: Procedure(s) (LRB): CYSTOSCOPY WITH RETROGRADE PYELOGRAM/URETERAL STENT PLACEMENT (Left)  Patient location during evaluation: PACU Anesthesia Type: General Level of consciousness: sedated Pain management: pain level controlled Vital Signs Assessment: post-procedure vital signs reviewed and stable Respiratory status: spontaneous breathing and respiratory function stable Cardiovascular status: stable Anesthetic complications: no    Last Vitals:  Vitals:   10/17/16 0004 10/17/16 0603  BP: 135/81 136/88  Pulse: 87 84  Resp:  16  Temp: 36.4 C 36.8 C            Jozalyn Baglio DANIEL

## 2016-10-17 NOTE — Progress Notes (Addendum)
WEnt over discharge paperwork with patient and family.  All questions answered.  Pt voided and denies pain. Explained importance of taking medications as prescribed and going to follow up appointment.  Pt walked out with family, refused wheelchair.

## 2016-10-17 NOTE — Discharge Summary (Signed)
Patient ID: PHILBERT BAR MRN: [id number] DOB/AGE: 01-31-1961 55 y.o.  Admit date: 10/16/2016 Discharge date: 10/17/2016  Primary Care Physician:  No primary care provider on file.  Discharge Diagnoses:   Present on Admission: . Calculus of ureter   Consults:  None  and patient was admitted for urgent placement  Discharge Medications:   Medication List    STOP taking these medications   TORADOL IJ     TAKE these medications   acetaminophen 500 MG tablet Commonly known as:  TYLENOL Take 1,000 mg by mouth every 6 (six) hours as needed for mild pain, moderate pain, fever or headache.   fluticasone 50 MCG/ACT nasal spray Commonly known as:  FLONASE Place 2 sprays into both nostrils at bedtime.   oxybutynin 5 MG tablet Commonly known as:  DITROPAN Take 1 tablet (5 mg total) by mouth every 8 (eight) hours as needed for bladder spasms.   oxyCODONE-acetaminophen 5-325 MG tablet Commonly known as:  PERCOCET/ROXICET Take 1-2 tablets by mouth every 4 (four) hours as needed for severe pain.        Significant Diagnostic Studies:  No results found.  Brief H and P: For complete details please refer to admission H and P, but in brief , The patient was admitted for urgent placement of the stent.  Because of significant stone pain.  Hospital Course:  the patient did well following stent placement.  He was discharged.  On postoperative day 1.   Active Problems:   Calculus of ureter   Calculus of kidney   Day of Discharge BP 136/88 (BP Location: Left Arm)   Pulse 84   Temp 98.3 F (36.8 C) (Oral)   Resp 16   Ht 6' (1.829 m)   Wt 103.3 kg (227 lb 11.2 oz)   SpO2 99%   BMI 30.88 kg/m   Results for orders placed or performed during the hospital encounter of 10/16/16 (from the past 24 hour(s))  Urinalysis, Routine w reflex microscopic- may I&O cath if menses     Status: Abnormal   Collection Time: 10/16/16  5:40 PM  Result Value Ref Range   Color, Urine YELLOW YELLOW    APPearance CLEAR CLEAR   Specific Gravity, Urine 1.023 1.005 - 1.030   pH 5.0 5.0 - 8.0   Glucose, UA NEGATIVE NEGATIVE mg/dL   Hgb urine dipstick MODERATE (A) NEGATIVE   Bilirubin Urine NEGATIVE NEGATIVE   Ketones, ur 5 (A) NEGATIVE mg/dL   Protein, ur NEGATIVE NEGATIVE mg/dL   Nitrite NEGATIVE NEGATIVE   Leukocytes, UA SMALL (A) NEGATIVE   RBC / HPF 6-30 0 - 5 RBC/hpf   WBC, UA 6-30 0 - 5 WBC/hpf   Bacteria, UA RARE (A) NONE SEEN   Squamous Epithelial / LPF NONE SEEN NONE SEEN   Mucous PRESENT   CBC with Differential     Status: Abnormal   Collection Time: 10/16/16  7:38 PM  Result Value Ref Range   WBC 10.3 4.0 - 10.5 K/uL   RBC 4.55 4.22 - 5.81 MIL/uL   Hemoglobin 13.8 13.0 - 17.0 g/dL   HCT 40.3 39.0 - 52.0 %   MCV 88.6 78.0 - 100.0 fL   MCH 30.3 26.0 - 34.0 pg   MCHC 34.2 30.0 - 36.0 g/dL   RDW 12.6 11.5 - 15.5 %   Platelets 217 150 - 400 K/uL   Neutrophils Relative % 89 %   Neutro Abs 9.1 (H) 1.7 - 7.7 K/uL   Lymphocytes Relative 5 %  Lymphs Abs 0.6 (L) 0.7 - 4.0 K/uL   Monocytes Relative 6 %   Monocytes Absolute 0.6 0.1 - 1.0 K/uL   Eosinophils Relative 0 %   Eosinophils Absolute 0.0 0.0 - 0.7 K/uL   Basophils Relative 0 %   Basophils Absolute 0.0 0.0 - 0.1 K/uL  Basic metabolic panel     Status: Abnormal   Collection Time: 10/16/16  7:38 PM  Result Value Ref Range   Sodium 135 135 - 145 mmol/L   Potassium 4.5 3.5 - 5.1 mmol/L   Chloride 103 101 - 111 mmol/L   CO2 24 22 - 32 mmol/L   Glucose, Bld 126 (H) 65 - 99 mg/dL   BUN 21 (H) 6 - 20 mg/dL   Creatinine, Ser 1.40 (H) 0.61 - 1.24 mg/dL   Calcium 8.7 (L) 8.9 - 10.3 mg/dL   GFR calc non Af Amer 56 (L) >60 mL/min   GFR calc Af Amer >60 >60 mL/min   Anion gap 8 5 - 15    Physical Exam: General: Alert and awake oriented x3 not in any acute distress. HEENT: anicteric sclera, pupils reactive to light and accommodation CVS: S1-S2 clear no murmur rubs or gallops Chest: clear to auscultation bilaterally,  no wheezing rales or rhonchi Abdomen: soft nontender, nondistended, normal bowel sounds, no organomegaly Extremities: no cyanosis, clubbing or edema noted bilaterally Neuro: Cranial nerves II-XII intact, no focal neurological deficits  Disposition:  Home  Diet:   regular, no restrictions  Activity:   no restrictions.    Disposition and Follow-up:     lithotripsy scheduled for 4 days  TESTS THAT NEED FOLLOW-UP  * none  DISCHARGE FOLLOW-UP   Time spent on Dis10 minutes  Signed: Jorja Loa 10/17/2016, 6:59 AM

## 2016-10-21 ENCOUNTER — Encounter (HOSPITAL_COMMUNITY)
Admission: RE | Disposition: A | Discharge status: Home / Self Care | Payer: Self-pay | Source: Ambulatory Visit | Attending: Urology

## 2016-10-21 ENCOUNTER — Encounter (HOSPITAL_COMMUNITY): Payer: Self-pay | Admitting: *Deleted

## 2016-10-21 ENCOUNTER — Ambulatory Visit (HOSPITAL_COMMUNITY)
Admission: RE | Admit: 2016-10-21 | Discharge: 2016-10-21 | Disposition: A | Discharge status: Home / Self Care | Payer: 59 | Source: Ambulatory Visit | Attending: Urology | Admitting: Urology

## 2016-10-21 ENCOUNTER — Ambulatory Visit (HOSPITAL_COMMUNITY): Payer: 59

## 2016-10-21 DIAGNOSIS — N201 Calculus of ureter: Secondary | ICD-10-CM

## 2016-10-21 DIAGNOSIS — N200 Calculus of kidney: Secondary | ICD-10-CM | POA: Insufficient documentation

## 2016-10-21 HISTORY — DX: Personal history of urinary calculi: Z87.442

## 2016-10-21 SURGERY — LITHOTRIPSY, ESWL
Anesthesia: LOCAL | Laterality: Left

## 2016-10-21 MED ORDER — SODIUM CHLORIDE 0.9 % IV SOLN
INTRAVENOUS | Status: DC
  Administered 2016-10-21: 08:00:00 via INTRAVENOUS

## 2016-10-21 MED ORDER — CIPROFLOXACIN HCL 500 MG PO TABS
500.0000 mg | ORAL_TABLET | ORAL | Status: AC
  Administered 2016-10-21: 500 mg via ORAL
  Filled 2016-10-21: qty 1

## 2016-10-21 MED ORDER — DIAZEPAM 5 MG PO TABS
10.0000 mg | ORAL_TABLET | ORAL | Status: AC
  Administered 2016-10-21: 10 mg via ORAL
  Filled 2016-10-21: qty 2

## 2016-10-21 MED ORDER — DIPHENHYDRAMINE HCL 25 MG PO CAPS
25.0000 mg | ORAL_CAPSULE | ORAL | Status: AC
  Administered 2016-10-21: 25 mg via ORAL
  Filled 2016-10-21: qty 1

## 2016-10-21 NOTE — Discharge Instructions (Signed)
See [facility] Discharge instructions.   Moderate Conscious Sedation, Adult, Care After These instructions provide you with information about caring for yourself after your procedure. Your health care provider may also give you more specific instructions. Your treatment has been planned according to current medical practices, but problems sometimes occur. Call your health care provider if you have any problems or questions after your procedure. What can I expect after the procedure? After your procedure, it is common:  To feel sleepy for several hours.  To feel clumsy and have poor balance for several hours.  To have poor judgment for several hours.  To vomit if you eat too soon. Follow these instructions at home: For at least 24 hours after the procedure:   Do not:  Participate in activities where you could fall or become injured.  Drive.  Use heavy machinery.  Drink alcohol.  Take sleeping pills or medicines that cause drowsiness.  Make important decisions or sign legal documents.  Take care of children on your own.  Rest. Eating and drinking  Follow the diet recommended by your health care provider.  If you vomit:  Drink water, juice, or soup when you can drink without vomiting.  Make sure you have little or no nausea before eating solid foods. General instructions  Have a responsible adult stay with you until you are awake and alert.  Take over-the-counter and prescription medicines only as told by your health care provider.  If you smoke, do not smoke without supervision.  Keep all follow-up visits as told by your health care provider. This is important. Contact a health care provider if:  You keep feeling nauseous or you keep vomiting.  You feel light-headed.  You develop a rash.  You have a fever. Get help right away if:  You have trouble breathing. This information is not intended to replace advice given to you by your health care provider.  Make sure you discuss any questions you have with your health care provider. Document Released: 08/18/2013 Document Revised: 04/01/2016 Document Reviewed: 02/17/2016 Elsevier Interactive Patient Education  2017 [employer].

## 2016-10-23 ENCOUNTER — Encounter (HOSPITAL_COMMUNITY): Payer: Self-pay | Admitting: Anesthesiology

## 2016-12-18 DIAGNOSIS — N200 Calculus of kidney: Secondary | ICD-10-CM | POA: Diagnosis not present

## 2016-12-20 DIAGNOSIS — N200 Calculus of kidney: Secondary | ICD-10-CM | POA: Diagnosis not present

## 2017-04-18 DIAGNOSIS — L237 Allergic contact dermatitis due to plants, except food: Secondary | ICD-10-CM | POA: Diagnosis not present

## 2017-04-23 DIAGNOSIS — Z0000 Encounter for general adult medical examination without abnormal findings: Secondary | ICD-10-CM | POA: Diagnosis not present

## 2017-04-23 DIAGNOSIS — Z136 Encounter for screening for cardiovascular disorders: Secondary | ICD-10-CM | POA: Diagnosis not present

## 2017-05-29 DIAGNOSIS — N200 Calculus of kidney: Secondary | ICD-10-CM | POA: Diagnosis not present

## 2017-06-25 DIAGNOSIS — G4733 Obstructive sleep apnea (adult) (pediatric): Secondary | ICD-10-CM | POA: Diagnosis not present

## 2017-06-25 DIAGNOSIS — N200 Calculus of kidney: Secondary | ICD-10-CM | POA: Diagnosis not present

## 2017-08-05 DIAGNOSIS — D225 Melanocytic nevi of trunk: Secondary | ICD-10-CM | POA: Diagnosis not present

## 2017-08-05 DIAGNOSIS — L821 Other seborrheic keratosis: Secondary | ICD-10-CM | POA: Diagnosis not present

## 2017-08-05 DIAGNOSIS — L814 Other melanin hyperpigmentation: Secondary | ICD-10-CM | POA: Diagnosis not present

## 2017-08-25 DIAGNOSIS — G4733 Obstructive sleep apnea (adult) (pediatric): Secondary | ICD-10-CM | POA: Diagnosis not present

## 2017-11-24 DIAGNOSIS — M9901 Segmental and somatic dysfunction of cervical region: Secondary | ICD-10-CM | POA: Diagnosis not present

## 2017-11-24 DIAGNOSIS — M9902 Segmental and somatic dysfunction of thoracic region: Secondary | ICD-10-CM | POA: Diagnosis not present

## 2017-11-24 DIAGNOSIS — M9903 Segmental and somatic dysfunction of lumbar region: Secondary | ICD-10-CM | POA: Diagnosis not present

## 2017-11-29 DIAGNOSIS — J9801 Acute bronchospasm: Secondary | ICD-10-CM | POA: Diagnosis not present

## 2017-11-29 DIAGNOSIS — J019 Acute sinusitis, unspecified: Secondary | ICD-10-CM | POA: Diagnosis not present

## 2017-12-22 DIAGNOSIS — M9902 Segmental and somatic dysfunction of thoracic region: Secondary | ICD-10-CM | POA: Diagnosis not present

## 2017-12-22 DIAGNOSIS — M9903 Segmental and somatic dysfunction of lumbar region: Secondary | ICD-10-CM | POA: Diagnosis not present

## 2017-12-22 DIAGNOSIS — M9901 Segmental and somatic dysfunction of cervical region: Secondary | ICD-10-CM | POA: Diagnosis not present

## 2018-01-19 DIAGNOSIS — M9902 Segmental and somatic dysfunction of thoracic region: Secondary | ICD-10-CM | POA: Diagnosis not present

## 2018-01-19 DIAGNOSIS — M9903 Segmental and somatic dysfunction of lumbar region: Secondary | ICD-10-CM | POA: Diagnosis not present

## 2018-01-19 DIAGNOSIS — M9901 Segmental and somatic dysfunction of cervical region: Secondary | ICD-10-CM | POA: Diagnosis not present

## 2018-02-16 DIAGNOSIS — M9901 Segmental and somatic dysfunction of cervical region: Secondary | ICD-10-CM | POA: Diagnosis not present

## 2018-02-16 DIAGNOSIS — M9902 Segmental and somatic dysfunction of thoracic region: Secondary | ICD-10-CM | POA: Diagnosis not present

## 2018-02-16 DIAGNOSIS — M9903 Segmental and somatic dysfunction of lumbar region: Secondary | ICD-10-CM | POA: Diagnosis not present

## 2018-03-04 DIAGNOSIS — Z0100 Encounter for examination of eyes and vision without abnormal findings: Secondary | ICD-10-CM | POA: Diagnosis not present

## 2018-03-05 DIAGNOSIS — M25561 Pain in right knee: Secondary | ICD-10-CM | POA: Diagnosis not present

## 2018-03-05 DIAGNOSIS — M19012 Primary osteoarthritis, left shoulder: Secondary | ICD-10-CM | POA: Diagnosis not present

## 2018-03-23 DIAGNOSIS — M9903 Segmental and somatic dysfunction of lumbar region: Secondary | ICD-10-CM | POA: Diagnosis not present

## 2018-03-23 DIAGNOSIS — M9902 Segmental and somatic dysfunction of thoracic region: Secondary | ICD-10-CM | POA: Diagnosis not present

## 2018-03-23 DIAGNOSIS — M9901 Segmental and somatic dysfunction of cervical region: Secondary | ICD-10-CM | POA: Diagnosis not present

## 2018-04-01 DIAGNOSIS — M9903 Segmental and somatic dysfunction of lumbar region: Secondary | ICD-10-CM | POA: Diagnosis not present

## 2018-04-01 DIAGNOSIS — M9902 Segmental and somatic dysfunction of thoracic region: Secondary | ICD-10-CM | POA: Diagnosis not present

## 2018-04-01 DIAGNOSIS — M9901 Segmental and somatic dysfunction of cervical region: Secondary | ICD-10-CM | POA: Diagnosis not present

## 2018-04-02 DIAGNOSIS — M25512 Pain in left shoulder: Secondary | ICD-10-CM | POA: Diagnosis not present

## 2018-04-15 DIAGNOSIS — M9902 Segmental and somatic dysfunction of thoracic region: Secondary | ICD-10-CM | POA: Diagnosis not present

## 2018-04-15 DIAGNOSIS — M9901 Segmental and somatic dysfunction of cervical region: Secondary | ICD-10-CM | POA: Diagnosis not present

## 2018-04-15 DIAGNOSIS — M9903 Segmental and somatic dysfunction of lumbar region: Secondary | ICD-10-CM | POA: Diagnosis not present

## 2018-05-06 DIAGNOSIS — Z13220 Encounter for screening for lipoid disorders: Secondary | ICD-10-CM | POA: Diagnosis not present

## 2018-05-06 DIAGNOSIS — Z0000 Encounter for general adult medical examination without abnormal findings: Secondary | ICD-10-CM | POA: Diagnosis not present

## 2018-05-06 DIAGNOSIS — Z1159 Encounter for screening for other viral diseases: Secondary | ICD-10-CM | POA: Diagnosis not present

## 2018-05-06 DIAGNOSIS — Z23 Encounter for immunization: Secondary | ICD-10-CM | POA: Diagnosis not present

## 2018-05-19 DIAGNOSIS — M9901 Segmental and somatic dysfunction of cervical region: Secondary | ICD-10-CM | POA: Diagnosis not present

## 2018-05-19 DIAGNOSIS — M9902 Segmental and somatic dysfunction of thoracic region: Secondary | ICD-10-CM | POA: Diagnosis not present

## 2018-05-19 DIAGNOSIS — M9903 Segmental and somatic dysfunction of lumbar region: Secondary | ICD-10-CM | POA: Diagnosis not present

## 2018-06-24 DIAGNOSIS — M9901 Segmental and somatic dysfunction of cervical region: Secondary | ICD-10-CM | POA: Diagnosis not present

## 2018-06-24 DIAGNOSIS — M9903 Segmental and somatic dysfunction of lumbar region: Secondary | ICD-10-CM | POA: Diagnosis not present

## 2018-06-24 DIAGNOSIS — M9902 Segmental and somatic dysfunction of thoracic region: Secondary | ICD-10-CM | POA: Diagnosis not present

## 2018-06-26 ENCOUNTER — Other Ambulatory Visit: Payer: Self-pay | Admitting: Nurse Practitioner

## 2018-06-26 ENCOUNTER — Ambulatory Visit
Admission: RE | Admit: 2018-06-26 | Discharge: 2018-06-26 | Disposition: A | Discharge status: Home / Self Care | Payer: No Typology Code available for payment source | Source: Ambulatory Visit | Attending: Nurse Practitioner | Admitting: Nurse Practitioner

## 2018-06-26 DIAGNOSIS — S8991XA Unspecified injury of right lower leg, initial encounter: Secondary | ICD-10-CM

## 2018-06-26 DIAGNOSIS — M25561 Pain in right knee: Secondary | ICD-10-CM

## 2018-06-29 DIAGNOSIS — G4733 Obstructive sleep apnea (adult) (pediatric): Secondary | ICD-10-CM | POA: Diagnosis not present

## 2018-07-20 DIAGNOSIS — M9901 Segmental and somatic dysfunction of cervical region: Secondary | ICD-10-CM | POA: Diagnosis not present

## 2018-07-20 DIAGNOSIS — M9903 Segmental and somatic dysfunction of lumbar region: Secondary | ICD-10-CM | POA: Diagnosis not present

## 2018-07-20 DIAGNOSIS — M9902 Segmental and somatic dysfunction of thoracic region: Secondary | ICD-10-CM | POA: Diagnosis not present

## 2018-08-11 DIAGNOSIS — L814 Other melanin hyperpigmentation: Secondary | ICD-10-CM | POA: Diagnosis not present

## 2018-08-11 DIAGNOSIS — L821 Other seborrheic keratosis: Secondary | ICD-10-CM | POA: Diagnosis not present

## 2018-08-11 DIAGNOSIS — L570 Actinic keratosis: Secondary | ICD-10-CM | POA: Diagnosis not present

## 2018-08-11 DIAGNOSIS — D1801 Hemangioma of skin and subcutaneous tissue: Secondary | ICD-10-CM | POA: Diagnosis not present

## 2018-08-24 DIAGNOSIS — M9902 Segmental and somatic dysfunction of thoracic region: Secondary | ICD-10-CM | POA: Diagnosis not present

## 2018-08-24 DIAGNOSIS — M9901 Segmental and somatic dysfunction of cervical region: Secondary | ICD-10-CM | POA: Diagnosis not present

## 2018-08-24 DIAGNOSIS — M9903 Segmental and somatic dysfunction of lumbar region: Secondary | ICD-10-CM | POA: Diagnosis not present

## 2018-09-03 DIAGNOSIS — M25512 Pain in left shoulder: Secondary | ICD-10-CM | POA: Diagnosis not present

## 2018-09-07 DIAGNOSIS — M23303 Other meniscus derangements, unspecified medial meniscus, right knee: Secondary | ICD-10-CM | POA: Diagnosis not present

## 2018-09-07 DIAGNOSIS — N200 Calculus of kidney: Secondary | ICD-10-CM | POA: Diagnosis not present

## 2018-09-21 DIAGNOSIS — M9901 Segmental and somatic dysfunction of cervical region: Secondary | ICD-10-CM | POA: Diagnosis not present

## 2018-09-21 DIAGNOSIS — M9903 Segmental and somatic dysfunction of lumbar region: Secondary | ICD-10-CM | POA: Diagnosis not present

## 2018-09-21 DIAGNOSIS — M9902 Segmental and somatic dysfunction of thoracic region: Secondary | ICD-10-CM | POA: Diagnosis not present

## 2018-10-26 DIAGNOSIS — M25562 Pain in left knee: Secondary | ICD-10-CM | POA: Diagnosis not present

## 2018-10-26 DIAGNOSIS — M25561 Pain in right knee: Secondary | ICD-10-CM | POA: Diagnosis not present

## 2018-10-26 DIAGNOSIS — M1712 Unilateral primary osteoarthritis, left knee: Secondary | ICD-10-CM | POA: Diagnosis not present

## 2018-10-26 DIAGNOSIS — M1711 Unilateral primary osteoarthritis, right knee: Secondary | ICD-10-CM | POA: Diagnosis not present

## 2018-11-02 DIAGNOSIS — M1711 Unilateral primary osteoarthritis, right knee: Secondary | ICD-10-CM | POA: Diagnosis not present

## 2018-11-02 DIAGNOSIS — M25562 Pain in left knee: Secondary | ICD-10-CM | POA: Diagnosis not present

## 2018-11-02 DIAGNOSIS — M1712 Unilateral primary osteoarthritis, left knee: Secondary | ICD-10-CM | POA: Diagnosis not present

## 2018-11-02 DIAGNOSIS — M25561 Pain in right knee: Secondary | ICD-10-CM | POA: Diagnosis not present

## 2018-11-09 DIAGNOSIS — M1711 Unilateral primary osteoarthritis, right knee: Secondary | ICD-10-CM | POA: Diagnosis not present

## 2018-11-09 DIAGNOSIS — N200 Calculus of kidney: Secondary | ICD-10-CM | POA: Diagnosis not present

## 2018-11-09 DIAGNOSIS — R140 Abdominal distension (gaseous): Secondary | ICD-10-CM | POA: Diagnosis not present

## 2018-11-09 DIAGNOSIS — M25562 Pain in left knee: Secondary | ICD-10-CM | POA: Diagnosis not present

## 2018-11-09 DIAGNOSIS — M2351 Chronic instability of knee, right knee: Secondary | ICD-10-CM | POA: Diagnosis not present

## 2018-11-09 DIAGNOSIS — M25561 Pain in right knee: Secondary | ICD-10-CM | POA: Diagnosis not present

## 2018-11-09 DIAGNOSIS — R109 Unspecified abdominal pain: Secondary | ICD-10-CM | POA: Diagnosis not present

## 2018-11-09 DIAGNOSIS — M1712 Unilateral primary osteoarthritis, left knee: Secondary | ICD-10-CM | POA: Diagnosis not present

## 2018-11-25 DIAGNOSIS — R194 Change in bowel habit: Secondary | ICD-10-CM | POA: Diagnosis not present

## 2020-01-14 ENCOUNTER — Ambulatory Visit: Payer: 59 | Attending: Internal Medicine

## 2020-01-14 DIAGNOSIS — Z23 Encounter for immunization: Secondary | ICD-10-CM | POA: Insufficient documentation

## 2020-01-14 NOTE — Progress Notes (Signed)
   Covid-19 Vaccination Clinic  Name:  Walter [name]    MRN: [id number] DOB: [birth year]  01/14/2020  Walter [name] was observed post Covid-19 immunization for 15 minutes without incident. He was provided with Vaccine Information Sheet and instruction to access the V-Safe system.   Walter [name] was instructed to call 911 with any severe reactions post vaccine: [name] Kitchen Difficulty breathing  . Swelling of face and throat  . A fast heartbeat  . A bad rash all over body  . Dizziness and weakness

## 2020-02-15 ENCOUNTER — Ambulatory Visit: Payer: 59 | Attending: Internal Medicine

## 2020-02-15 DIAGNOSIS — Z23 Encounter for immunization: Secondary | ICD-10-CM

## 2020-02-15 NOTE — Progress Notes (Signed)
   Covid-19 Vaccination Clinic  Name:  Walter [name]    MRN: [id number] DOB: November 28, 1960  02/15/2020  Mr. Walter [name] was observed post Covid-19 immunization for 15 minutes without incident. He was provided with Vaccine Information Sheet and instruction to access the V-Safe system.   Mr. Walter [name] was instructed to call 911 with any severe reactions post vaccine: [name] Kitchen Difficulty breathing  . Swelling of face and throat  . A fast heartbeat  . A bad rash all over body  . Dizziness and weakness   Immunizations Administered    Name Date Dose VIS Date Route   Pfizer COVID-19 Vaccine 02/15/2020  8:20 AM 0.3 mL 10/22/2019 Intramuscular   Manufacturer: [employer], [employer]   Lot: [id number]   [place]: [id number]

## 2020-03-27 ENCOUNTER — Encounter (INDEPENDENT_AMBULATORY_CARE_PROVIDER_SITE_OTHER): Payer: Self-pay | Admitting: Otolaryngology

## 2020-03-27 ENCOUNTER — Other Ambulatory Visit: Payer: Self-pay

## 2020-03-27 ENCOUNTER — Ambulatory Visit (INDEPENDENT_AMBULATORY_CARE_PROVIDER_SITE_OTHER): Payer: 59 | Admitting: Otolaryngology

## 2020-03-27 VITALS — Temp 97.9°F

## 2020-03-27 DIAGNOSIS — J342 Deviated nasal septum: Secondary | ICD-10-CM

## 2020-03-27 DIAGNOSIS — J343 Hypertrophy of nasal turbinates: Secondary | ICD-10-CM | POA: Diagnosis not present

## 2020-03-27 DIAGNOSIS — J310 Chronic rhinitis: Secondary | ICD-10-CM | POA: Diagnosis not present

## 2020-03-27 NOTE — Progress Notes (Signed)
HPI: Walter [name] is a 59 y.o. male who presents for evaluation of nasal congestion that is worse at night when he tries to sleep.  It frequently wakes him up because he cannot breathe well through the nose.  It alternates from side to side depending on which side he sleeps on.  He has been seen previously 4 years ago with nasal congestion and septal deviation.  He has tried saline rinses including Xlear.  He has not been using any nasal steroid spray.  Past Medical History:  Diagnosis Date  . History of kidney stones   . Renal disorder    Past Surgical History:  Procedure Laterality Date  . CYSTOSCOPY W/ URETERAL STENT PLACEMENT Left 10/16/2016   Procedure: CYSTOSCOPY WITH RETROGRADE PYELOGRAM/URETERAL STENT PLACEMENT;  Surgeon: Franchot Gallo, MD;  Location: [facility];  Service: Urology;  Laterality: Left;  . KNEE SURGERY    . SHOULDER SURGERY    . WRIST SURGERY     Social History   Socioeconomic History  . Marital status: Married    Spouse name: Not on file  . Number of children: Not on file  . Years of education: Not on file  . Highest education level: Not on file  Occupational History  . Not on file  Tobacco Use  . Smoking status: Former Smoker    Packs/day: 1.00    Years: 8.00    Pack years: 8.00    Start date: 27    Quit date: 10/15/1982    Years since quitting: 37.4  . Smokeless tobacco: Never Used  Substance and Sexual Activity  . Alcohol use: Yes  . Drug use: No  . Sexual activity: Not on file  Other Topics Concern  . Not on file  Social History Narrative  . Not on file   Social Determinants of Health   Financial Resource Strain:   . Difficulty of Paying Living Expenses:   Food Insecurity:   . Worried About Charity fundraiser in the Last Year:   . Arboriculturist in the Last Year:   Transportation Needs:   . Film/video editor (Medical):   [name] Kitchen Lack of Transportation (Non-Medical):   Physical Activity:   . Days of Exercise per Week:   . Minutes  of Exercise per Session:   Stress:   . Feeling of Stress :   Social Connections:   . Frequency of Communication with Friends and Family:   . Frequency of Social Gatherings with Friends and Family:   . Attends Religious Services:   . Active Member of Clubs or Organizations:   . Attends Archivist Meetings:   [name] Kitchen Marital Status:    No family history on file. No Known Allergies Prior to Admission medications   Medication Sig Start Date End Date Taking? Authorizing Provider  acetaminophen (TYLENOL) 500 MG tablet Take 1,000 mg by mouth every 6 (six) hours as needed for mild pain, moderate pain, fever or headache.   Yes [provider]  fluticasone (FLONASE) 50 MCG/ACT nasal spray Place 2 sprays into both nostrils at bedtime.   Yes [provider]  oxybutynin (DITROPAN) 5 MG tablet Take 1 tablet (5 mg total) by mouth every 8 (eight) hours as needed for bladder spasms. 10/16/16  Yes Dahlstedt, Annie Main, MD  oxyCODONE-acetaminophen (PERCOCET/ROXICET) 5-325 MG tablet Take 1-2 tablets by mouth every 4 (four) hours as needed for severe pain.    Yes [provider]     Positive ROS: Otherwise negative  All other systems have been reviewed and were otherwise negative with the exception of those mentioned in the HPI and as above.  Physical Exam: Constitutional: Alert, well-appearing, no acute distress Ears: External ears without lesions or tenderness. Ear canals are clear bilaterally with intact, clear TMs.  Nasal: External nose without lesions. Septum deviated to the left with compensatory right-sided turbinate per trophy.  After decongesting the nose nasal passages were otherwise clear with no polyps no obstructing lesions and no signs of infection.. Oral: Lips and gums without lesions. Tongue and palate mucosa without lesions. Posterior oropharynx clear. Neck: No palpable adenopathy or masses Respiratory: Breathing comfortably  Skin: No facial/neck lesions or  rash noted.  Procedures  Assessment: Septal deviation with chronic rhinitis  Plan: Prescribed Nasacort 2 sprays each nostril at night and would use this regularly for the next month.  Consider elevation of the head of bed. If problems persist could consider septoplasty and turbinate reductions. He will follow-up as needed  Radene Journey, MD

## 2022-04-09 ENCOUNTER — Other Ambulatory Visit: Payer: Self-pay | Admitting: Orthopedic Surgery

## 2022-04-10 ENCOUNTER — Other Ambulatory Visit: Payer: Self-pay | Admitting: Orthopedic Surgery

## 2022-04-10 DIAGNOSIS — S92001A Unspecified fracture of right calcaneus, initial encounter for closed fracture: Secondary | ICD-10-CM

## 2022-04-11 ENCOUNTER — Ambulatory Visit
Admission: RE | Admit: 2022-04-11 | Discharge: 2022-04-11 | Disposition: A | Discharge status: Home / Self Care | Payer: 59 | Source: Ambulatory Visit | Attending: Orthopedic Surgery | Admitting: Orthopedic Surgery

## 2022-04-11 DIAGNOSIS — S92001A Unspecified fracture of right calcaneus, initial encounter for closed fracture: Secondary | ICD-10-CM

## 2022-04-12 ENCOUNTER — Other Ambulatory Visit: Payer: Self-pay | Admitting: Orthopaedic Surgery

## 2022-04-18 NOTE — Pre-Procedure Instructions (Signed)
Surgical Instructions    Your procedure is scheduled on Thursday, June 15th.  Report to [facility] Main Entrance "A" at 07:00 A.M., then check in with the Admitting office.  Call this number if you have problems the morning of surgery:  [phone]   If you have any questions prior to your surgery date call [phone]: Open Monday-Friday 8am-4pm    Remember:  Do not eat after midnight the night before your surgery  You may drink clear liquids until 06:00 AM the morning of your surgery.   Clear liquids allowed are: Water, Non-Citrus Juices (without pulp), Carbonated Beverages, Clear Tea, Black Coffee Only (NO MILK, CREAM OR POWDERED CREAMER of any kind), and Gatorade.    Take these medicines the morning of surgery with A SIP OF WATER  acetaminophen (TYLENOL)- if needed oxycodone (OXY-IR)- if needed   As of today, STOP taking any Aspirin (unless otherwise instructed by your surgeon) Aleve, Naproxen, Ibuprofen, Motrin, Advil, Goody's, BC's, all herbal medications, fish oil, and all vitamins.                     Do NOT Smoke (Tobacco/Vaping) for 24 hours prior to your procedure.  If you use a CPAP at night, you may bring your mask/headgear for your overnight stay.   Contacts, glasses, piercing's, hearing aid's, dentures or partials may not be worn into surgery, please bring cases for these belongings.    For patients admitted to the hospital, discharge time will be determined by your treatment team.   Patients discharged the day of surgery will not be allowed to drive home, and someone needs to stay with them for 24 hours.  SURGICAL WAITING ROOM VISITATION Patients having surgery or a procedure may have two support people in the waiting room. These visitors may be switched out with other visitors if needed. Children under the age of 60 must have an adult accompany them who is not the patient. If the patient needs to stay at the hospital during part of their recovery, the visitor  guidelines for inpatient rooms apply.  Please refer to the [facility] website for the visitor guidelines for Inpatients (after your surgery is over and you are in a regular room).    Special instructions:   [facility]- Preparing For Surgery  Before surgery, you can play an important role. Because skin is not sterile, your skin needs to be as free of germs as possible. You can reduce the number of germs on your skin by washing with CHG (chlorahexidine gluconate) Soap before surgery.  CHG is an antiseptic cleaner which kills germs and bonds with the skin to continue killing germs even after washing.    Oral Hygiene is also important to reduce your risk of infection.  Remember - BRUSH YOUR TEETH THE MORNING OF SURGERY WITH YOUR REGULAR TOOTHPASTE  Please do not use if you have an allergy to CHG or antibacterial soaps. If your skin becomes reddened/irritated stop using the CHG.  Do not shave (including legs and underarms) for at least 48 hours prior to first CHG shower. It is OK to shave your face.  Please follow these instructions carefully.   Shower the NIGHT BEFORE SURGERY and the MORNING OF SURGERY  If you chose to wash your hair, wash your hair first as usual with your normal shampoo.  After you shampoo, rinse your hair and body thoroughly to remove the shampoo.  Use CHG Soap as you would any other liquid soap. You can apply  CHG directly to the skin and wash gently with a scrungie or a clean washcloth.   Apply the CHG Soap to your body ONLY FROM THE NECK DOWN.  Do not use on open wounds or open sores. Avoid contact with your eyes, ears, mouth and genitals (private parts). Wash Face and genitals (private parts)  with your normal soap.   Wash thoroughly, paying special attention to the area where your surgery will be performed.  Thoroughly rinse your body with warm water from the neck down.  DO NOT shower/wash with your normal soap after using and rinsing off the CHG Soap.  Pat  yourself dry with a CLEAN TOWEL.  Wear CLEAN PAJAMAS to bed the night before surgery  Place CLEAN SHEETS on your bed the night before your surgery  DO NOT SLEEP WITH PETS.   Day of Surgery: Take a shower with CHG soap. Do not wear jewelry  Do not wear lotions, powders, colognes, or deodorant. Men may shave face and neck. Do not bring valuables to the hospital.  [facility] is not responsible for any belongings or valuables.  Wear Clean/Comfortable clothing the morning of surgery Remember to brush your teeth WITH YOUR REGULAR TOOTHPASTE.   Please read over the following fact sheets that you were given.    If you received a COVID test during your pre-op visit  it is requested that you wear a mask when out in public, stay away from anyone that may not be feeling well and notify your surgeon if you develop symptoms. If you have been in contact with anyone that has tested positive in the last 10 days please notify you surgeon.

## 2022-04-19 ENCOUNTER — Other Ambulatory Visit: Payer: Self-pay

## 2022-04-19 ENCOUNTER — Encounter (HOSPITAL_COMMUNITY)
Admission: RE | Admit: 2022-04-19 | Discharge: 2022-04-19 | Disposition: A | Discharge status: Home / Self Care | Payer: 59 | Source: Ambulatory Visit | Attending: Orthopaedic Surgery | Admitting: Orthopaedic Surgery

## 2022-04-19 ENCOUNTER — Encounter (HOSPITAL_COMMUNITY): Payer: Self-pay

## 2022-04-19 VITALS — BP 137/86 | HR 95 | Temp 98.4°F | Resp 17 | Ht 72.0 in | Wt 231.8 lb

## 2022-04-19 DIAGNOSIS — Z01812 Encounter for preprocedural laboratory examination: Secondary | ICD-10-CM | POA: Insufficient documentation

## 2022-04-19 DIAGNOSIS — Z01818 Encounter for other preprocedural examination: Secondary | ICD-10-CM

## 2022-04-19 LAB — CBC
HCT: 41.6 % (ref 39.0–52.0)
Hemoglobin: 14.5 g/dL (ref 13.0–17.0)
MCH: 30.8 pg (ref 26.0–34.0)
MCHC: 34.9 g/dL (ref 30.0–36.0)
MCV: 88.3 fL (ref 80.0–100.0)
Platelets: 273 10*3/uL (ref 150–400)
RBC: 4.71 MIL/uL (ref 4.22–5.81)
RDW: 12.2 % (ref 11.5–15.5)
WBC: 6.2 10*3/uL (ref 4.0–10.5)
nRBC: 0 % (ref 0.0–0.2)

## 2022-04-19 LAB — SURGICAL PCR SCREEN
MRSA, PCR: NEGATIVE
Staphylococcus aureus: NEGATIVE

## 2022-04-19 NOTE — Progress Notes (Signed)
PCP - Dr. Calla Kicks Cardiologist - Denies  PPM/ICD - Denies Device Orders -  Rep Notified -   Chest x-ray - NI EKG - NI Stress Test - Denies ECHO - Denies Cardiac Cath - Denies  Sleep Study - Yes has OSA CPAP - not used in several weeks  DM - Denies   Anesthesia review: No  Patient denies shortness of breath, fever, cough and chest pain at PAT appointment   All instructions explained to the patient, with a verbal understanding of the material. Patient agrees to go over the instructions while at home for a better understanding. The opportunity to ask questions was provided.

## 2022-04-24 NOTE — Anesthesia Preprocedure Evaluation (Addendum)
Anesthesia Evaluation  Patient identified by MRN, date of birth, ID band Patient awake    Reviewed: Allergy & Precautions, NPO status , Patient's Chart, lab work & pertinent test results  History of Anesthesia Complications Negative for: history of anesthetic complications  Airway Mallampati: II  TM Distance: >3 FB Neck ROM: Full    Dental  (+) Caps, Dental Advisory Given   Pulmonary former smoker,    breath sounds clear to auscultation       Cardiovascular (-) anginanegative cardio ROS   Rhythm:Regular Rate:Normal     Neuro/Psych negative neurological ROS     GI/Hepatic negative GI ROS, Neg liver ROS,   Endo/Other  obese  Renal/GU H/o stones     Musculoskeletal   Abdominal (+) + obese,   Peds  Hematology negative hematology ROS (+)   Anesthesia Other Findings   Reproductive/Obstetrics                            Anesthesia Physical Anesthesia Plan  ASA: 2  Anesthesia Plan: General   Post-op Pain Management: Regional block* and Tylenol PO (pre-op)*   Induction: Intravenous  PONV Risk Score and Plan: 2 and Ondansetron and Dexamethasone  Airway Management Planned: Oral ETT  Additional Equipment: None  Intra-op Plan:   Post-operative Plan: Extubation in OR  Informed Consent: I have reviewed the patients History and Physical, chart, labs and discussed the procedure including the risks, benefits and alternatives for the proposed anesthesia with the patient or authorized representative who has indicated his/her understanding and acceptance.     Dental advisory given  Plan Discussed with: CRNA and Surgeon  Anesthesia Plan Comments: (Plan routine monitors, GA with adductor canal and popliteal block for post op analgesia)       Anesthesia Quick Evaluation

## 2022-04-25 ENCOUNTER — Ambulatory Visit (HOSPITAL_COMMUNITY): Payer: 59

## 2022-04-25 ENCOUNTER — Encounter (HOSPITAL_COMMUNITY)
Admission: RE | Disposition: A | Discharge status: Home / Self Care | Payer: Self-pay | Source: Home / Self Care | Attending: Orthopaedic Surgery

## 2022-04-25 ENCOUNTER — Ambulatory Visit (HOSPITAL_BASED_OUTPATIENT_CLINIC_OR_DEPARTMENT_OTHER): Payer: 59 | Admitting: Anesthesiology

## 2022-04-25 ENCOUNTER — Encounter (HOSPITAL_COMMUNITY): Payer: Self-pay | Admitting: Orthopaedic Surgery

## 2022-04-25 ENCOUNTER — Other Ambulatory Visit: Payer: Self-pay

## 2022-04-25 ENCOUNTER — Ambulatory Visit (HOSPITAL_COMMUNITY)
Admission: RE | Admit: 2022-04-25 | Discharge: 2022-04-25 | Disposition: A | Discharge status: Home / Self Care | Payer: 59 | Attending: Orthopaedic Surgery | Admitting: Orthopaedic Surgery

## 2022-04-25 ENCOUNTER — Ambulatory Visit (HOSPITAL_COMMUNITY): Payer: 59 | Admitting: Anesthesiology

## 2022-04-25 DIAGNOSIS — S92061A Displaced intraarticular fracture of right calcaneus, initial encounter for closed fracture: Secondary | ICD-10-CM | POA: Insufficient documentation

## 2022-04-25 DIAGNOSIS — Y9351 Activity, roller skating (inline) and skateboarding: Secondary | ICD-10-CM | POA: Diagnosis not present

## 2022-04-25 DIAGNOSIS — S92001A Unspecified fracture of right calcaneus, initial encounter for closed fracture: Secondary | ICD-10-CM | POA: Diagnosis not present

## 2022-04-25 DIAGNOSIS — Z87891 Personal history of nicotine dependence: Secondary | ICD-10-CM | POA: Insufficient documentation

## 2022-04-25 DIAGNOSIS — S86311A Strain of muscle(s) and tendon(s) of peroneal muscle group at lower leg level, right leg, initial encounter: Secondary | ICD-10-CM | POA: Insufficient documentation

## 2022-04-25 HISTORY — PX: TENOLYSIS: SHX396

## 2022-04-25 HISTORY — PX: OPEN REDUCTION, INTERNAL FIXATION (ORIF) CALCANEAL FRACTURE WITH FUSION: SHX5994

## 2022-04-25 HISTORY — PX: TENDON REPAIR: SHX5111

## 2022-04-25 SURGERY — OPEN REDUCTION, INTERNAL FIXATION (ORIF) CALCANEAL FRACTURE WITH FUSION
Anesthesia: General | Laterality: Right

## 2022-04-25 MED ORDER — DEXAMETHASONE SODIUM PHOSPHATE 10 MG/ML IJ SOLN
INTRAMUSCULAR | Status: AC
  Filled 2022-04-25: qty 1

## 2022-04-25 MED ORDER — OXYCODONE HCL 5 MG PO TABS: 5.0000 mg | ORAL_TABLET | Freq: Once | ORAL | Status: DC | PRN

## 2022-04-25 MED ORDER — ONDANSETRON HCL 4 MG/2ML IJ SOLN
INTRAMUSCULAR | Status: AC
  Filled 2022-04-25: qty 2

## 2022-04-25 MED ORDER — ROCURONIUM BROMIDE 10 MG/ML (PF) SYRINGE
PREFILLED_SYRINGE | INTRAVENOUS | Status: DC | PRN
  Administered 2022-04-25: 70 mg via INTRAVENOUS

## 2022-04-25 MED ORDER — ONDANSETRON HCL 4 MG/2ML IJ SOLN
INTRAMUSCULAR | Status: DC | PRN
  Administered 2022-04-25: 4 mg via INTRAVENOUS

## 2022-04-25 MED ORDER — PROPOFOL 10 MG/ML IV BOLUS
INTRAVENOUS | Status: AC
  Filled 2022-04-25: qty 20

## 2022-04-25 MED ORDER — DEXAMETHASONE SODIUM PHOSPHATE 10 MG/ML IJ SOLN
INTRAMUSCULAR | Status: DC | PRN
  Administered 2022-04-25: 10 mg via INTRAVENOUS

## 2022-04-25 MED ORDER — OXYCODONE HCL 5 MG/5ML PO SOLN: 5.0000 mg | Freq: Once | ORAL | Status: DC | PRN

## 2022-04-25 MED ORDER — ACETAMINOPHEN 500 MG PO TABS
1000.0000 mg | ORAL_TABLET | Freq: Once | ORAL | Status: AC
  Administered 2022-04-25: 1000 mg via ORAL
  Filled 2022-04-25: qty 2

## 2022-04-25 MED ORDER — CHLORHEXIDINE GLUCONATE 0.12 % MT SOLN
15.0000 mL | Freq: Once | OROMUCOSAL | Status: AC
  Administered 2022-04-25: 15 mL via OROMUCOSAL
  Filled 2022-04-25: qty 15

## 2022-04-25 MED ORDER — MEPERIDINE HCL 25 MG/ML IJ SOLN: 6.2500 mg | INTRAMUSCULAR | Status: DC | PRN

## 2022-04-25 MED ORDER — FENTANYL CITRATE (PF) 100 MCG/2ML IJ SOLN: 100.0000 ug | Freq: Once | INTRAMUSCULAR | Status: AC

## 2022-04-25 MED ORDER — PHENYLEPHRINE HCL-NACL 20-0.9 MG/250ML-% IV SOLN
INTRAVENOUS | Status: DC | PRN
  Administered 2022-04-25: 25 ug/min via INTRAVENOUS

## 2022-04-25 MED ORDER — PROPOFOL 10 MG/ML IV BOLUS
INTRAVENOUS | Status: DC | PRN
  Administered 2022-04-25: 150 mg via INTRAVENOUS

## 2022-04-25 MED ORDER — MIDAZOLAM HCL 2 MG/2ML IJ SOLN
INTRAMUSCULAR | Status: AC
Start: 2022-04-25 — End: ?
  Filled 2022-04-25: qty 2

## 2022-04-25 MED ORDER — EPHEDRINE SULFATE-NACL 50-0.9 MG/10ML-% IV SOSY
PREFILLED_SYRINGE | INTRAVENOUS | Status: DC | PRN
  Administered 2022-04-25 (×2): 5 mg via INTRAVENOUS
  Administered 2022-04-25: 10 mg via INTRAVENOUS

## 2022-04-25 MED ORDER — FENTANYL CITRATE (PF) 100 MCG/2ML IJ SOLN
INTRAMUSCULAR | Status: AC
  Administered 2022-04-25: 100 ug via INTRAVENOUS
  Filled 2022-04-25: qty 2

## 2022-04-25 MED ORDER — HYDROMORPHONE HCL 1 MG/ML IJ SOLN: 0.2500 mg | INTRAMUSCULAR | Status: DC | PRN

## 2022-04-25 MED ORDER — VANCOMYCIN HCL 500 MG IV SOLR
INTRAVENOUS | Status: AC
  Filled 2022-04-25: qty 10

## 2022-04-25 MED ORDER — 0.9 % SODIUM CHLORIDE (POUR BTL) OPTIME
TOPICAL | Status: DC | PRN
  Administered 2022-04-25: 1000 mL

## 2022-04-25 MED ORDER — ORAL CARE MOUTH RINSE: 15.0000 mL | Freq: Once | OROMUCOSAL | Status: AC

## 2022-04-25 MED ORDER — FENTANYL CITRATE (PF) 250 MCG/5ML IJ SOLN
INTRAMUSCULAR | Status: AC
  Filled 2022-04-25: qty 5

## 2022-04-25 MED ORDER — MIDAZOLAM HCL 2 MG/2ML IJ SOLN: 0.5000 mg | Freq: Once | INTRAMUSCULAR | Status: DC | PRN

## 2022-04-25 MED ORDER — BUPIVACAINE-EPINEPHRINE (PF) 0.5% -1:200000 IJ SOLN
INTRAMUSCULAR | Status: DC | PRN
  Administered 2022-04-25: 10 mL via PERINEURAL
  Administered 2022-04-25: 25 mL via PERINEURAL

## 2022-04-25 MED ORDER — PHENYLEPHRINE 80 MCG/ML (10ML) SYRINGE FOR IV PUSH (FOR BLOOD PRESSURE SUPPORT)
PREFILLED_SYRINGE | INTRAVENOUS | Status: DC | PRN
  Administered 2022-04-25 (×2): 40 ug via INTRAVENOUS
  Administered 2022-04-25: 80 ug via INTRAVENOUS
  Administered 2022-04-25: 40 ug via INTRAVENOUS
  Administered 2022-04-25: 80 ug via INTRAVENOUS
  Administered 2022-04-25 (×2): 40 ug via INTRAVENOUS

## 2022-04-25 MED ORDER — VANCOMYCIN HCL 500 MG IV SOLR
INTRAVENOUS | Status: DC | PRN
  Administered 2022-04-25: 500 mg via TOPICAL

## 2022-04-25 MED ORDER — FENTANYL CITRATE (PF) 250 MCG/5ML IJ SOLN
INTRAMUSCULAR | Status: DC | PRN
  Administered 2022-04-25: 50 ug via INTRAVENOUS
  Administered 2022-04-25: 100 ug via INTRAVENOUS
  Administered 2022-04-25 (×2): 50 ug via INTRAVENOUS

## 2022-04-25 MED ORDER — MIDAZOLAM HCL 2 MG/2ML IJ SOLN
INTRAMUSCULAR | Status: AC
  Administered 2022-04-25: 2 mg via INTRAVENOUS
  Filled 2022-04-25: qty 2

## 2022-04-25 MED ORDER — ASPIRIN 325 MG PO TABS: 325.0000 mg | ORAL_TABLET | Freq: Every day | ORAL | 0 refills | Status: AC

## 2022-04-25 MED ORDER — MIDAZOLAM HCL 2 MG/2ML IJ SOLN: 2.0000 mg | Freq: Once | INTRAMUSCULAR | Status: AC

## 2022-04-25 MED ORDER — SUGAMMADEX SODIUM 200 MG/2ML IV SOLN
INTRAVENOUS | Status: DC | PRN
  Administered 2022-04-25: 200 mg via INTRAVENOUS

## 2022-04-25 MED ORDER — LIDOCAINE 2% (20 MG/ML) 5 ML SYRINGE
INTRAMUSCULAR | Status: DC | PRN
  Administered 2022-04-25: 40 mg via INTRAVENOUS

## 2022-04-25 MED ORDER — LIDOCAINE 2% (20 MG/ML) 5 ML SYRINGE
INTRAMUSCULAR | Status: AC
Start: 2022-04-25 — End: ?
  Filled 2022-04-25: qty 5

## 2022-04-25 MED ORDER — OXYCODONE HCL 5 MG PO TABS: 5.0000 mg | ORAL_TABLET | ORAL | 0 refills | Status: AC | PRN

## 2022-04-25 MED ORDER — LACTATED RINGERS IV SOLN: INTRAVENOUS | Status: DC

## 2022-04-25 MED ORDER — CEFAZOLIN SODIUM-DEXTROSE 2-4 GM/100ML-% IV SOLN
2.0000 g | INTRAVENOUS | Status: AC
  Administered 2022-04-25: 2 g via INTRAVENOUS
  Filled 2022-04-25: qty 100

## 2022-04-25 MED ORDER — EPHEDRINE 5 MG/ML INJ
INTRAVENOUS | Status: AC
  Filled 2022-04-25: qty 5

## 2022-04-25 MED ORDER — ROCURONIUM BROMIDE 10 MG/ML (PF) SYRINGE
PREFILLED_SYRINGE | INTRAVENOUS | Status: AC
Start: 2022-04-25 — End: ?
  Filled 2022-04-25: qty 10

## 2022-04-25 SURGICAL SUPPLY — 71 items
BAG COUNTER SPONGE SURGICOUNT (BAG) IMPLANT
BANDAGE ESMARK 6X9 LF (GAUZE/BANDAGES/DRESSINGS) IMPLANT
BENZOIN TINCTURE PRP APPL 2/3 (GAUZE/BANDAGES/DRESSINGS) IMPLANT
BIT DRILL 2.5 CANN STRL (BIT) ×1 IMPLANT
BIT DRILL CANNULATED 3MM (DRILL) IMPLANT
BLADE SURG 15 STRL LF DISP TIS (BLADE) ×2 IMPLANT
BLADE SURG 15 STRL SS (BLADE) ×4
BNDG ELASTIC 6X10 VLCR STRL LF (GAUZE/BANDAGES/DRESSINGS) ×2 IMPLANT
BNDG ESMARK 6X9 LF (GAUZE/BANDAGES/DRESSINGS) ×2
CHLORAPREP W/TINT 26 (MISCELLANEOUS) ×4 IMPLANT
CUFF TOURN SGL QUICK 34 (TOURNIQUET CUFF) ×2
CUFF TRNQT CYL 34X4.125X (TOURNIQUET CUFF) ×1 IMPLANT
DRAPE C-ARM 42X72 X-RAY (DRAPES) ×2 IMPLANT
DRAPE C-ARMOR (DRAPES) ×2 IMPLANT
DRAPE EXTREMITY T 121X128X90 (DISPOSABLE) ×2 IMPLANT
DRAPE IMP U-DRAPE 54X76 (DRAPES) ×2 IMPLANT
DRAPE U-SHAPE 47X51 STRL (DRAPES) ×2 IMPLANT
DRILL CANNULATED 3MM (DRILL) ×2
DRSG PAD ABDOMINAL 8X10 ST (GAUZE/BANDAGES/DRESSINGS) ×1 IMPLANT
DRSG XEROFORM 1X8 (GAUZE/BANDAGES/DRESSINGS) ×1 IMPLANT
ELECT REM PT RETURN 9FT ADLT (ELECTROSURGICAL) ×2
ELECTRODE REM PT RTRN 9FT ADLT (ELECTROSURGICAL) ×1 IMPLANT
GAUZE SPONGE 4X4 12PLY STRL (GAUZE/BANDAGES/DRESSINGS) ×1 IMPLANT
GAUZE SPONGE 4X4 12PLY STRL LF (GAUZE/BANDAGES/DRESSINGS) ×2 IMPLANT
GLOVE BIOGEL M STRL SZ7.5 (GLOVE) ×4 IMPLANT
GLOVE BIOGEL PI IND STRL 8 (GLOVE) ×1 IMPLANT
GLOVE BIOGEL PI INDICATOR 8 (GLOVE) ×3
GOWN STRL REUS W/ TWL LRG LVL3 (GOWN DISPOSABLE) ×1 IMPLANT
GOWN STRL REUS W/ TWL XL LVL3 (GOWN DISPOSABLE) ×1 IMPLANT
GOWN STRL REUS W/TWL LRG LVL3 (GOWN DISPOSABLE) ×4
GOWN STRL REUS W/TWL XL LVL3 (GOWN DISPOSABLE) ×6
GUIDEWIRE 1.6 (WIRE) ×4
GUIDEWIRE ORTH 157X1.6XTROC (WIRE) IMPLANT
KIT BASIN OR (CUSTOM PROCEDURE TRAY) ×2 IMPLANT
NS IRRIG 1000ML POUR BTL (IV SOLUTION) ×2 IMPLANT
PACK ORTHO EXTREMITY (CUSTOM PROCEDURE TRAY) ×2 IMPLANT
PAD CAST 4YDX4 CTTN HI CHSV (CAST SUPPLIES) ×1 IMPLANT
PADDING CAST COTTON 4X4 STRL (CAST SUPPLIES) ×2
PADDING CAST COTTON 6X4 STRL (CAST SUPPLIES) ×1 IMPLANT
PADDING CAST SYN 6 (CAST SUPPLIES) ×2
PADDING CAST SYNTHETIC 4 (CAST SUPPLIES) ×1
PADDING CAST SYNTHETIC 4X4 STR (CAST SUPPLIES) ×1 IMPLANT
PADDING CAST SYNTHETIC 6X4 NS (CAST SUPPLIES) IMPLANT
PLATE CALC AP LONG L 7H RT (Plate) ×1 IMPLANT
SCREW CANN FT ANKLE 4.5X70 (Screw) ×1 IMPLANT
SCREW KREULOCK 3.5 X 28 (Screw) ×1 IMPLANT
SCREW KREULOCK 3.5X36 (Screw) ×1 IMPLANT
SCREW KREULOCK CLAV 3.5X34 (Screw) ×1 IMPLANT
SCREW KREULOCK CLAV 3.5X40 (Screw) ×1 IMPLANT
SCREW LOCK TI FT 3.5X42 (Screw) ×1 IMPLANT
SCREW LOCK TI FT 3.5X44 (Screw) ×1 IMPLANT
SCREW LOCK TI FT 3.5X46 (Screw) ×1 IMPLANT
SCREW LP TITA 3.5X46 (Screw) ×2 IMPLANT
SPIKE FLUID TRANSFER (MISCELLANEOUS) IMPLANT
SPLINT PLASTER CAST XFAST 5X30 (CAST SUPPLIES) IMPLANT
SPLINT PLASTER XFAST SET 5X30 (CAST SUPPLIES) ×2
SPONGE T-LAP 18X18 ~~LOC~~+RFID (SPONGE) ×1 IMPLANT
STRIP CLOSURE SKIN 1/2X4 (GAUZE/BANDAGES/DRESSINGS) IMPLANT
SUCTION FRAZIER HANDLE 10FR (MISCELLANEOUS) ×2
SUCTION TUBE FRAZIER 10FR DISP (MISCELLANEOUS) ×1 IMPLANT
SUT ETHILON 3 0 PS 1 (SUTURE) ×2 IMPLANT
SUT FIBERWIRE 2-0 18 17.9 3/8 (SUTURE) ×2
SUT MNCRL AB 3-0 PS2 18 (SUTURE) ×2 IMPLANT
SUT PDS AB 2-0 CT2 27 (SUTURE) ×1 IMPLANT
SUT VIC AB 0 CT1 27 (SUTURE)
SUT VIC AB 0 CT1 27XBRD ANBCTR (SUTURE) IMPLANT
SUT VIC AB 2-0 FS1 27 (SUTURE) ×1 IMPLANT
SUTURE FIBERWR 2-0 18 17.9 3/8 (SUTURE) IMPLANT
TOWEL GREEN STERILE FF (TOWEL DISPOSABLE) ×4 IMPLANT
TUBE CONNECTING 20X1/4 (TUBING) ×4 IMPLANT
UNDERPAD 30X36 HEAVY ABSORB (UNDERPADS AND DIAPERS) ×2 IMPLANT

## 2022-04-25 NOTE — Brief Op Note (Signed)
04/25/2022  9:57 AM  PATIENT:  Walter [name]  61 y.o. male  PRE-OPERATIVE DIAGNOSIS:  RIGHT DISPLACED CALCANEUS FRACTURE  POST-OPERATIVE DIAGNOSIS:  RIGHT DISPLACED CALCANEUS FRACTURE  PROCEDURE:  Procedure(s): OPEN REDUCTION INTERNAL FIXATION OF RIGHT CALCANEUS FRACTURE (Right) REPAIR OF PERONEAL TENDONS (Right) REPAIR OF DISLOCATING PERONEAL TENDONS (Right)  SURGEON:  Surgeon(s) and Role:    Erle Crocker, MD - Primary  PHYSICIAN ASSISTANT: Jesse Martinique, PA-C  ASSISTANTS: none   ANESTHESIA:   general  EBL:  30 mL   BLOOD ADMINISTERED:none  DRAINS: none   LOCAL MEDICATIONS USED:  NONE  SPECIMEN:  No Specimen  DISPOSITION OF SPECIMEN:  N/A  COUNTS:  YES  TOURNIQUET:  * Missing tourniquet times found for documented tourniquets in log: [id number] *  DICTATION: .Viviann Spare Dictation  PLAN OF CARE: Discharge to home after PACU  PATIENT DISPOSITION:  PACU - hemodynamically stable.   Delay start of Pharmacological VTE agent (>24hrs) due to surgical blood loss or risk of bleeding: no

## 2022-04-25 NOTE — H&P (Signed)
PREOPERATIVE H&P  Chief Complaint: Right intra-articular displaced calcaneus fracture  HPI: Walter [name] is a 61 y.o. male who presents for preoperative history and physical with a diagnosis of right calcaneus fracture.  He sustained this while skateboarding.  He had depression of his posterior facet with varus deformity and comminution.  He was placed in a splint initially and sent to me for evaluation.  He is here today for surgical intervention after a short period of soft tissue rest. Symptoms are rated as moderate to severe, and have been worsening.  This is significantly impairing activities of daily living.  He has elected for surgical management.   Past Medical History:  Diagnosis Date   History of kidney stones    Renal disorder    Past Surgical History:  Procedure Laterality Date   CYSTOSCOPY W/ URETERAL STENT PLACEMENT Left 10/16/2016   Procedure: CYSTOSCOPY WITH RETROGRADE PYELOGRAM/URETERAL STENT PLACEMENT;  Surgeon: Franchot Gallo, MD;  Location: [facility];  Service: Urology;  Laterality: Left;   KNEE SURGERY     SHOULDER SURGERY     WRIST SURGERY     Social History   Socioeconomic History   Marital status: Married    Spouse name: Angela Nevin   Number of children: 2   Years of education: Not on file   Highest education level: Not on file  Occupational History   Not on file  Tobacco Use   Smoking status: Former    Packs/day: 1.00    Years: 8.00    Total pack years: 8.00    Types: Cigarettes    Start date: 37    Quit date: 10/15/1982    Years since quitting: 39.5   Smokeless tobacco: Never  Vaping Use   Vaping Use: Never used  Substance and Sexual Activity   Alcohol use: Yes    Comment: social   Drug use: No   Sexual activity: Yes  Other Topics Concern   Not on file  Social History Narrative   Not on file   Social Determinants of Health   Financial Resource Strain: Not on file  Food Insecurity: Not on file  Transportation Needs: Not on file  Physical  Activity: Not on file  Stress: Not on file  Social Connections: Not on file   History reviewed. No pertinent family history. No Known Allergies Prior to Admission medications   Medication Sig Start Date End Date Taking? Authorizing Provider  acetaminophen (TYLENOL) 500 MG tablet Take 500 mg by mouth every 6 (six) hours as needed for mild pain, moderate pain, fever or headache.   Yes [provider]  b complex vitamins capsule Take 1 capsule by mouth every other day.   Yes [provider]  ibuprofen (ADVIL) 200 MG tablet Take 200 mg by mouth every 6 (six) hours as needed for moderate pain.   Yes [provider]  NON FORMULARY Pt uses a cpap nightly   Yes [provider]  oxycodone (OXY-IR) 5 MG capsule Take 5 mg by mouth every 6 (six) hours as needed for severe pain. 04/05/22  Yes [provider]  oxybutynin (DITROPAN) 5 MG tablet Take 1 tablet (5 mg total) by mouth every 8 (eight) hours as needed for bladder spasms. Patient not taking: Reported on 04/15/2022 10/16/16   Franchot Gallo, MD     Positive ROS: All other systems have been reviewed and were otherwise negative with the exception of those mentioned in the HPI and as above.  Physical Exam:  Vitals:  04/25/22 0541  BP: (!) 136/91  Pulse: 78  Resp: 17  Temp: 98.2 F (36.8 C)  SpO2: 96%   General: Alert, no acute distress Cardiovascular: No pedal edema Respiratory: No cyanosis, no use of accessory musculature GI: No organomegaly, abdomen is soft and non-tender Skin: No lesions in the area of chief complaint Neurologic: Sensation intact distally Psychiatric: Patient is competent for consent with normal mood and affect Lymphatic: No axillary or cervical lymphadenopathy  MUSCULOSKELETAL: Right leg in a short leg splint.  There is swelling and ecchymosis to the toes.  No tenderness palpation about the forefoot.  No tenderness proximal to the splint.  Sensation grossly intact  distally.  Foot is warm and well-perfused.  No evidence of other joint or extremity injury.  Assessment: Right displaced intra-articular calcaneus fracture   Plan: Plan for open treatment of his calcaneus fracture.  We will also assess his peroneal tendons for any evidence of instability or injury.  We discussed the risks, benefits and alternatives of surgery which include but are not limited to wound healing complications, infection, nonunion, malunion, need for further surgery, damage to surrounding structures and continued pain.  They understand there is no guarantees to an acceptable outcome.  After weighing these risks they opted to proceed with surgery.     Erle Crocker, MD    04/25/2022 7:14 AM

## 2022-04-25 NOTE — Transfer of Care (Signed)
Immediate Anesthesia Transfer of Care Note  Patient: Eliezer Lofts  Procedure(s) Performed: OPEN REDUCTION INTERNAL FIXATION OF RIGHT CALCANEUS FRACTURE (Right) REPAIR OF PERONEAL TENDONS (Right) REPAIR OF DISLOCATING PERONEAL TENDONS (Right)  Patient Location: PACU  Anesthesia Type:GA combined with regional for post-op pain  Level of Consciousness: drowsy, patient cooperative and obtunded  Airway & Oxygen Therapy: Patient Spontanous Breathing and Patient connected to nasal cannula oxygen  Post-op Assessment: Report given to RN and Post -op Vital signs reviewed and stable  Post vital signs: Reviewed and stable  Last Vitals:  Vitals Value Taken Time  BP 145/74 04/25/22 1031  Temp 36.6 C 04/25/22 1030  Pulse 114 04/25/22 1042  Resp 18 04/25/22 1042  SpO2 95 % 04/25/22 1042  Vitals shown include unvalidated device data.  Last Pain:  Vitals:   04/25/22 1030  TempSrc:   PainSc: 0-No pain         Complications: No notable events documented.

## 2022-04-25 NOTE — Anesthesia Procedure Notes (Addendum)
Anesthesia Regional Block: Adductor canal block   Pre-Anesthetic Checklist: , timeout performed,  Correct Patient, Correct Site, Correct Laterality,  Correct Procedure, Correct Position, site marked,  Risks and benefits discussed,  Surgical consent,  Pre-op evaluation,  At surgeon's request and post-op pain management  Laterality: Right and Lower  Prep: chloraprep       Needles:  Injection technique: Single-shot  Needle Type: Echogenic Needle     Needle Length: 9cm  Needle Gauge: 21     Additional Needles:   Procedures:,,,, ultrasound used (permanent image in chart),,    Narrative:  Start time: 04/25/2022 6:52 AM End time: 04/25/2022 6:58 AM Injection made incrementally with aspirations every 5 mL.  Performed by: Personally  Anesthesiologist: Annye Asa, MD  Additional Notes: Pt identified in Holding room.  Monitors applied. Working IV access confirmed. Sterile prep R thigh.  #21ga ECHOgenic Arrow block needle into adductor canal with US guidance.  10cc 0.5% Bupivacaine 1:200k epi injected incrementally after negative test dose.  Patient asymptomatic, VSS, no heme aspirated, tolerated well.   Jenita Seashore, MD

## 2022-04-25 NOTE — Anesthesia Procedure Notes (Signed)
Anesthesia Regional Block: Popliteal block   Pre-Anesthetic Checklist: , timeout performed,  Correct Patient, Correct Site, Correct Laterality,  Correct Procedure, Correct Position, site marked,  Risks and benefits discussed,  Surgical consent,  Pre-op evaluation,  At surgeon's request and post-op pain management  Laterality: Right and Lower  Prep: chloraprep       Needles:  Injection technique: Single-shot  Needle Type: Echogenic Needle     Needle Length: 9cm  Needle Gauge: 21     Additional Needles:   Procedures:,,,, ultrasound used (permanent image in chart),,    Narrative:  Start time: 04/25/2022 6:59 AM End time: 04/25/2022 7:02 AM Injection made incrementally with aspirations every 5 mL.  Performed by: Personally  Anesthesiologist: Annye Asa, MD  Additional Notes: Pt identified in Holding room.  Monitors applied. Working IV access confirmed. Sterile prep R lateral knee/distal thigh.  #21ga ECHOgenic Arrow block needle to sciatic nerve at split in pop fossa with US guidance.  25cc 0.5% Bupivacaine 1:200k epi injected incrementally after negative test dose.  Patient asymptomatic, VSS, no heme aspirated, tolerated well.   Jenita Seashore, MD

## 2022-04-25 NOTE — Anesthesia Procedure Notes (Signed)
Procedure Name: Intubation Date/Time: 04/25/2022 7:37 AM  Performed by: Michele Rockers, CRNAPre-anesthesia Checklist: Patient identified, Patient being monitored, Timeout performed, Emergency Drugs available and Suction available Patient Re-evaluated:Patient Re-evaluated prior to induction Oxygen Delivery Method: Circle System Utilized Preoxygenation: Pre-oxygenation with 100% oxygen Induction Type: IV induction Ventilation: Mask ventilation without difficulty Laryngoscope Size: Miller and 2 Grade View: Grade I Tube type: Oral Tube size: 7.5 mm Number of attempts: 1 Airway Equipment and Method: Stylet Placement Confirmation: ETT inserted through vocal cords under direct vision, positive ETCO2 and breath sounds checked- equal and bilateral Secured at: 22 cm Tube secured with: Tape Dental Injury: Teeth and Oropharynx as per pre-operative assessment

## 2022-04-25 NOTE — Discharge Instructions (Signed)

## 2022-04-25 NOTE — Anesthesia Postprocedure Evaluation (Signed)
Anesthesia Post Note  Patient: Walter [name]  Procedure(s) Performed: OPEN REDUCTION INTERNAL FIXATION OF RIGHT CALCANEUS FRACTURE (Right) REPAIR OF PERONEAL TENDONS (Right) REPAIR OF DISLOCATING PERONEAL TENDONS (Right)     Patient location during evaluation: PACU Anesthesia Type: General Level of consciousness: awake and alert, patient cooperative and oriented Pain management: pain level controlled Vital Signs Assessment: post-procedure vital signs reviewed and stable Respiratory status: spontaneous breathing, nonlabored ventilation and respiratory function stable Cardiovascular status: blood pressure returned to baseline and stable Postop Assessment: no apparent nausea or vomiting Anesthetic complications: no   No notable events documented.  Last Vitals:  Vitals:   04/25/22 1045 04/25/22 1100  BP: (!) 152/94 (!) 157/88  Pulse: (!) 110 (!) 103  Resp: 12 10  Temp:  36.8 C  SpO2: 96% 96%    Last Pain:  Vitals:   04/25/22 1100  TempSrc:   PainSc: 0-No pain                 Gotham Raden,E. Matti Minney

## 2022-04-29 ENCOUNTER — Encounter (HOSPITAL_COMMUNITY): Payer: Self-pay | Admitting: Orthopaedic Surgery

## 2022-07-11 ENCOUNTER — Other Ambulatory Visit: Payer: Self-pay | Admitting: Internal Medicine

## 2022-07-11 DIAGNOSIS — Z0000 Encounter for general adult medical examination without abnormal findings: Secondary | ICD-10-CM

## 2022-07-19 ENCOUNTER — Ambulatory Visit
Admission: RE | Admit: 2022-07-19 | Discharge: 2022-07-19 | Disposition: A | Discharge status: Home / Self Care | Payer: 59 | Source: Ambulatory Visit | Attending: Internal Medicine | Admitting: Internal Medicine

## 2022-07-19 DIAGNOSIS — Z0000 Encounter for general adult medical examination without abnormal findings: Secondary | ICD-10-CM

## 2023-06-11 IMAGING — CT CT FOOT*R* W/O CM
3 of 5 series · 12 of 20 positions shown, 14 images · non-contrast
Comparison: None

CLINICAL DATA: Calcaneus fracture.

EXAM:
CT OF THE RIGHT FOOT WITHOUT CONTRAST
TECHNIQUE: Multidetector CT imaging of the right foot was performed according
to the standard protocol. Multiplanar CT image reconstructions were
also generated.
RADIATION DOSE REDUCTION: This exam was performed according to the
departmental dose-optimization program which includes automated
exposure control, adjustment of the mA and/or kV according to
patient size and/or use of iterative reconstruction technique.

[Series 4: lower ext 1.5 st · axial · 0.54mm/px · z∈[-214,-66]mm · 7 of 133 slices shown, 9 images]
[im 17/133  soft-tissue]
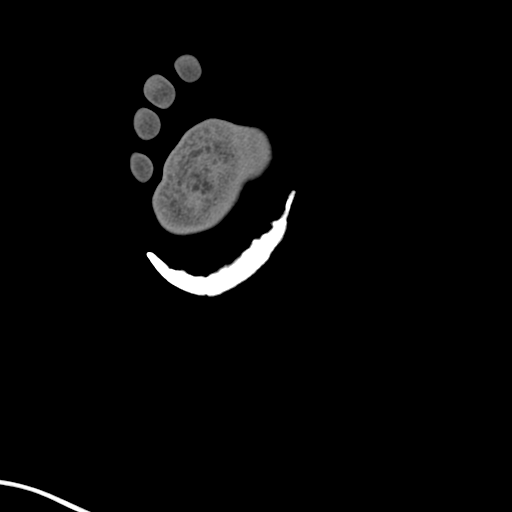
[im 17/133  bone]
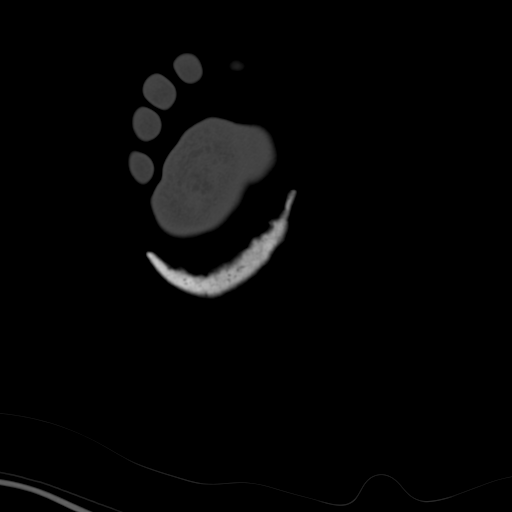
[im 34/133  bone]
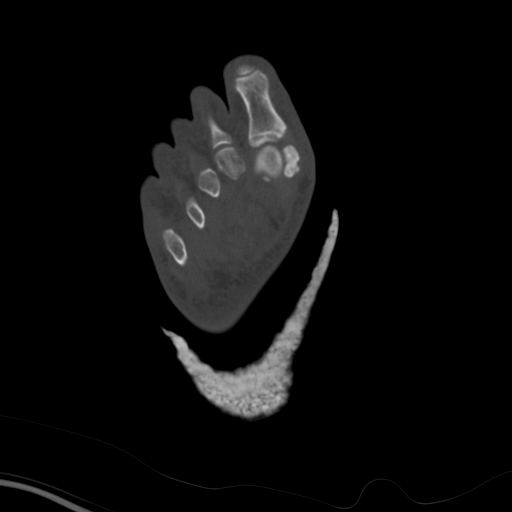
[im 50/133  bone]
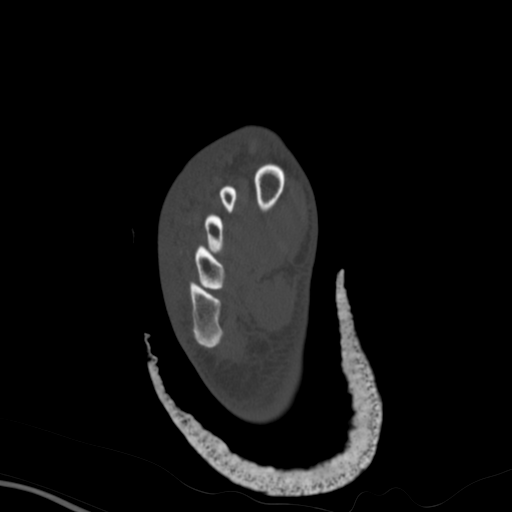
[im 67/133  bone]
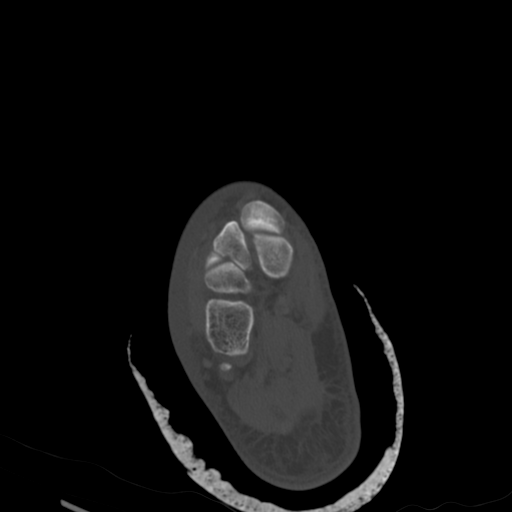
[im 83/133  soft-tissue]
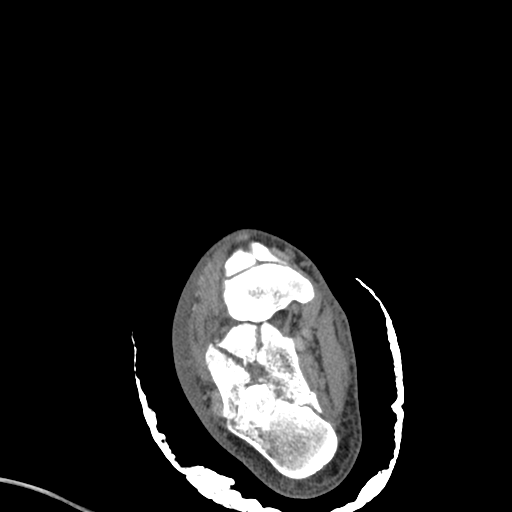
[im 83/133  bone]
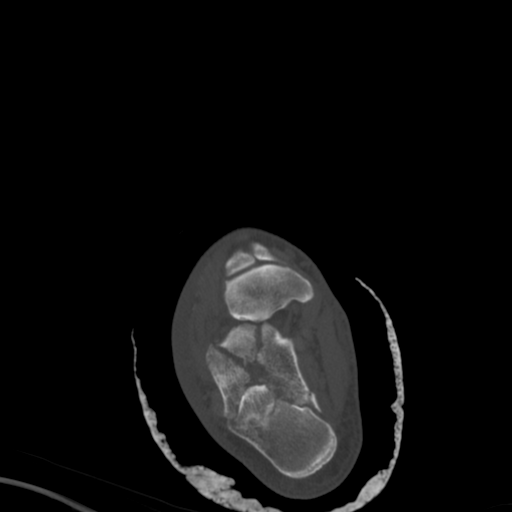
[im 100/133  bone]
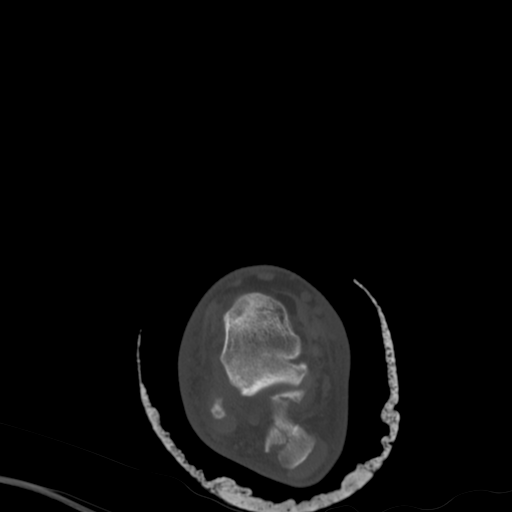
[im 116/133  bone]
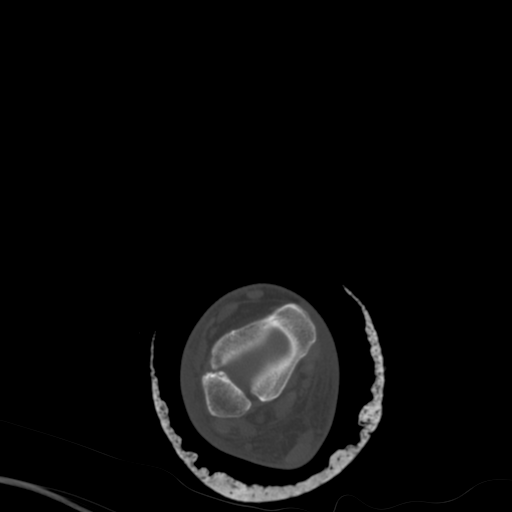

[Series 603: axial bone · coronal · 0.67mm/px · 2 of 184 slices shown]
[im 82/184  bone]
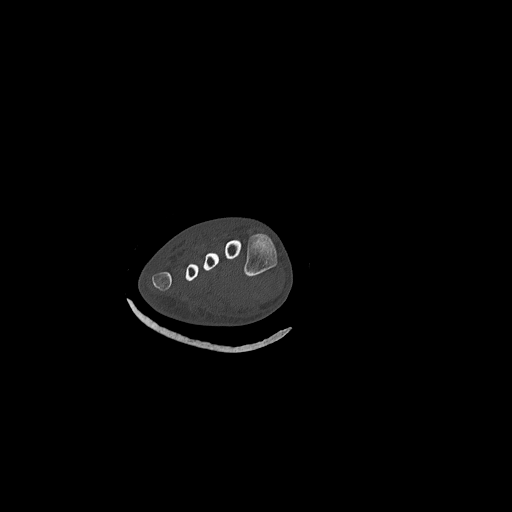
[im 169/184  bone]
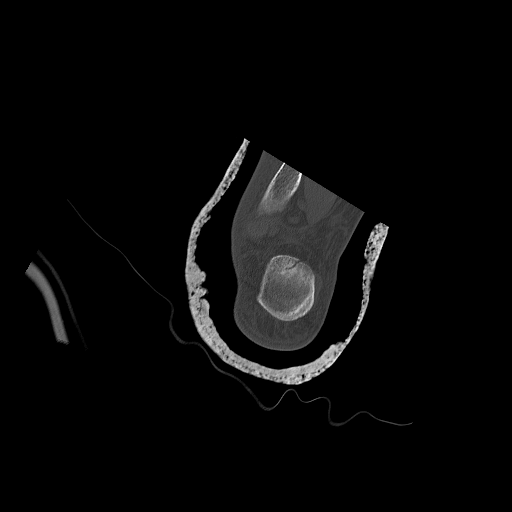

[Series 606: long axis · axial · 0.27mm/px · z∈[-149,-98]mm · 3 of 71 slices shown]
[im 18/71  bone]
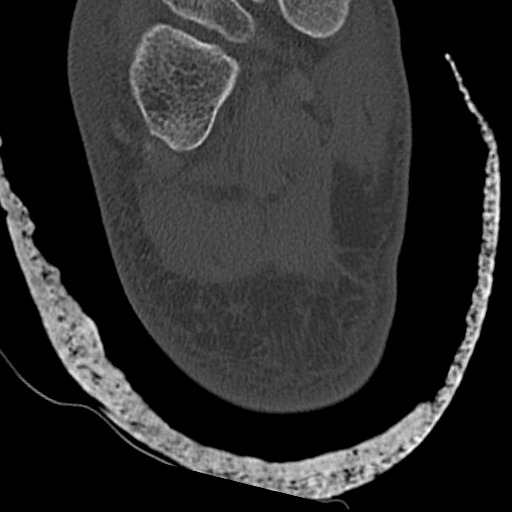
[im 36/71  bone]
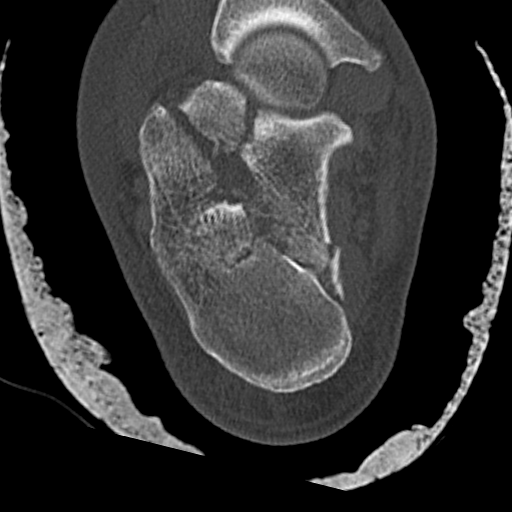
[im 53/71  bone]
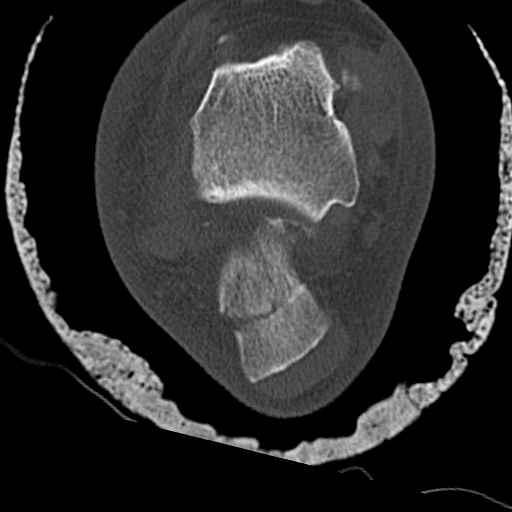

[12 of 20 positions shown; findings below may reference images not displayed]

FINDINGS: Complex comminuted central depression type calcaneal fracture. There
is marked depression and plantar rotation of the posterior calcaneal
facet. Maximum depression estimated at 14 mm. Displaced fracture
through the base of the sustentaculum is noted. There is also a
minimally displaced fracture involving the anterior process of the
calcaneus.

The tibiotalar joint space is maintained. The talus is intact. No
cuboid or other midfoot fractures are identified.
IMPRESSION: 1. Complex comminuted central depression type calcaneal fracture as
detailed above.
2. No ankle or midfoot/forefoot fractures.
